# Patient Record
Sex: Male | Born: 1940 | Race: White | Hispanic: No | Marital: Single | Smoking: Never smoker
Health system: Southern US, Community
[De-identification: ages and names within clinical notes are randomized; demographics above are authoritative.]

## PROBLEM LIST (undated history)

## (undated) DIAGNOSIS — G20 Parkinson's disease: Secondary | ICD-10-CM

## (undated) DIAGNOSIS — E7800 Pure hypercholesterolemia, unspecified: Secondary | ICD-10-CM

## (undated) DIAGNOSIS — I10 Essential (primary) hypertension: Secondary | ICD-10-CM

## (undated) DIAGNOSIS — E119 Type 2 diabetes mellitus without complications: Secondary | ICD-10-CM

---

## 1998-11-23 ENCOUNTER — Ambulatory Visit (HOSPITAL_COMMUNITY): Admission: RE | Admit: 1998-11-23 | Discharge: 1998-11-23 | Payer: Self-pay | Admitting: Family Medicine

## 1998-11-23 ENCOUNTER — Encounter: Payer: Self-pay | Admitting: Family Medicine

## 2019-09-06 ENCOUNTER — Ambulatory Visit: Payer: Self-pay | Attending: Internal Medicine

## 2019-09-06 DIAGNOSIS — Z23 Encounter for immunization: Secondary | ICD-10-CM | POA: Insufficient documentation

## 2019-09-06 NOTE — Progress Notes (Signed)
   Covid-19 Vaccination Clinic  Name:  John Thornton    MRN: [id number] DOB: 28-Apr-1941  09/06/2019  Mr. John Thornton was observed post Covid-19 immunization for 15 minutes without incidence. He was provided with Vaccine Information Sheet and instruction to access the V-Safe system.   Mr. John Thornton was instructed to call 911 with any severe reactions post vaccine: Marland Kitchen Difficulty breathing  . Swelling of your face and throat  . A fast heartbeat  . A bad rash all over your body  . Dizziness and weakness    Immunizations Administered    Name Date Dose VIS Date Route   Pfizer COVID-19 Vaccine 09/06/2019 11:01 AM 0.3 mL 06/26/2019 Intramuscular   Manufacturer: [employer], [employer]   Lot: [id number]   NDC: [address]-2

## 2019-09-30 ENCOUNTER — Ambulatory Visit: Payer: Self-pay | Attending: Internal Medicine

## 2019-09-30 DIAGNOSIS — Z23 Encounter for immunization: Secondary | ICD-10-CM

## 2019-09-30 NOTE — Progress Notes (Signed)
   Covid-19 Vaccination Clinic  Name:  John Thornton    MRN: [id number] DOB: Nov 11, 1940  09/30/2019  [name] was observed post Covid-19 immunization for 15 minutes without incident. He was provided with Vaccine Information Sheet and instruction to access the V-Safe system.   [name] was instructed to call 911 with any severe reactions post vaccine: Marland Kitchen Difficulty breathing  . Swelling of face and throat  . A fast heartbeat  . A bad rash all over body  . Dizziness and weakness   Immunizations Administered    Name Date Dose VIS Date Route   Pfizer COVID-19 Vaccine 09/30/2019  9:57 AM 0.3 mL 06/26/2019 Intramuscular   Manufacturer: [employer], [employer]   Lot: [id number]   NDC: [address]-3

## 2019-11-17 DIAGNOSIS — G20A1 Parkinson's disease without dyskinesia, without mention of fluctuations: Secondary | ICD-10-CM | POA: Diagnosis present

## 2019-11-17 DIAGNOSIS — G20 Parkinson's disease: Secondary | ICD-10-CM | POA: Diagnosis present

## 2020-02-13 ENCOUNTER — Other Ambulatory Visit: Payer: Self-pay

## 2020-02-13 ENCOUNTER — Encounter (HOSPITAL_COMMUNITY): Payer: Self-pay | Admitting: Emergency Medicine

## 2020-02-13 ENCOUNTER — Emergency Department (HOSPITAL_COMMUNITY)
Admission: EM | Admit: 2020-02-13 | Discharge: 2020-02-13 | Disposition: A | Discharge status: Home / Self Care | Payer: No Typology Code available for payment source | Attending: Emergency Medicine | Admitting: Emergency Medicine

## 2020-02-13 ENCOUNTER — Emergency Department (HOSPITAL_COMMUNITY): Payer: No Typology Code available for payment source

## 2020-02-13 DIAGNOSIS — G20 Parkinson's disease: Secondary | ICD-10-CM | POA: Diagnosis not present

## 2020-02-13 DIAGNOSIS — E1165 Type 2 diabetes mellitus with hyperglycemia: Secondary | ICD-10-CM | POA: Insufficient documentation

## 2020-02-13 DIAGNOSIS — I10 Essential (primary) hypertension: Secondary | ICD-10-CM | POA: Diagnosis not present

## 2020-02-13 DIAGNOSIS — R739 Hyperglycemia, unspecified: Secondary | ICD-10-CM

## 2020-02-13 HISTORY — DX: Pure hypercholesterolemia, unspecified: E78.00

## 2020-02-13 HISTORY — DX: Parkinson's disease: G20

## 2020-02-13 HISTORY — DX: Essential (primary) hypertension: I10

## 2020-02-13 HISTORY — DX: Type 2 diabetes mellitus without complications: E11.9

## 2020-02-13 LAB — BASIC METABOLIC PANEL
Anion gap: 11 (ref 5–15)
BUN: 17 mg/dL (ref 8–23)
CO2: 23 mmol/L (ref 22–32)
Calcium: 8.5 mg/dL — ABNORMAL LOW (ref 8.9–10.3)
Chloride: 99 mmol/L (ref 98–111)
Creatinine, Ser: 0.9 mg/dL (ref 0.61–1.24)
GFR calc Af Amer: >60 mL/min (ref >60)
GFR calc non Af Amer: >60 mL/min (ref >60)
Glucose, Bld: 363 mg/dL — ABNORMAL HIGH (ref 70–99)
Potassium: 3.8 mmol/L (ref 3.5–5.1)
Sodium: 133 mmol/L — ABNORMAL LOW (ref 135–145)

## 2020-02-13 LAB — CBC WITH DIFFERENTIAL/PLATELET
Abs Immature Granulocytes: 0.02 10*3/uL (ref 0.00–0.07)
Basophils Absolute: 0 10*3/uL (ref 0.0–0.1)
Basophils Relative: 1 %
Eosinophils Absolute: 0.1 10*3/uL (ref 0.0–0.5)
Eosinophils Relative: 1 %
HCT: 34.3 % — ABNORMAL LOW (ref 39.0–52.0)
Hemoglobin: 12.3 g/dL — ABNORMAL LOW (ref 13.0–17.0)
Immature Granulocytes: 0 %
Lymphocytes Relative: 26 %
Lymphs Abs: 1.8 10*3/uL (ref 0.7–4.0)
MCH: 33.1 pg (ref 26.0–34.0)
MCHC: 35.9 g/dL (ref 30.0–36.0)
MCV: 92.2 fL (ref 80.0–100.0)
Monocytes Absolute: 0.6 10*3/uL (ref 0.1–1.0)
Monocytes Relative: 9 %
Neutro Abs: 4.4 10*3/uL (ref 1.7–7.7)
Neutrophils Relative %: 63 %
Platelets: 213 10*3/uL (ref 150–400)
RBC: 3.72 MIL/uL — ABNORMAL LOW (ref 4.22–5.81)
RDW: 12.3 % (ref 11.5–15.5)
WBC: 7 10*3/uL (ref 4.0–10.5)
nRBC: 0 % (ref 0.0–0.2)

## 2020-02-13 LAB — URINALYSIS, ROUTINE W REFLEX MICROSCOPIC
Bacteria, UA: NONE SEEN
Bilirubin Urine: NEGATIVE
Glucose, UA: >=500 mg/dL — AB
Hgb urine dipstick: NEGATIVE
Ketones, ur: NEGATIVE mg/dL
Leukocytes,Ua: NEGATIVE
Nitrite: NEGATIVE
Protein, ur: NEGATIVE mg/dL
Specific Gravity, Urine: 1.018 (ref 1.005–1.030)
pH: 5 (ref 5.0–8.0)

## 2020-02-13 LAB — CBG MONITORING, ED
Glucose-Capillary: 328 mg/dL — ABNORMAL HIGH (ref 70–99)
Glucose-Capillary: 275 mg/dL — ABNORMAL HIGH (ref 70–99)

## 2020-02-13 MED ORDER — SODIUM CHLORIDE 0.9 % IV BOLUS
1000.0000 mL | Freq: Once | INTRAVENOUS | Status: AC
  Administered 2020-02-13: 1000 mL via INTRAVENOUS

## 2020-02-13 NOTE — ED Triage Notes (Signed)
Pt states his neighbor checked on him today, BS 389 and with confusion, unsteady gait that started at 0600 this morning,

## 2020-02-13 NOTE — Discharge Instructions (Addendum)
You were evaluated in the Emergency Department and after careful evaluation, we did not find any emergent condition requiring admission or further testing in the hospital.  Your exam/testing today was overall reassuring.  It is very important that you take your diabetes medications as directed.  We recommend close follow-up with your primary care doctor to discuss your medications.  Please return to the Emergency Department if you experience any worsening of your condition.  Thank you for allowing [place] to be a part of your care.

## 2020-02-13 NOTE — ED Provider Notes (Signed)
[facility]-EMERGENCY DEPT [facility] Emergency Department Provider Note MRN:  [id number]  Arrival date & time: 02/13/20     Chief Complaint   Hyperglycemia   History of Present Illness   John Thornton is a 79 y.o. year-old male with a history of hypertension, diabetes presenting to the ED with chief complaint of hyperglycemia.  Patient feeling confused and "foggy headed" today.  Denies fever, no cough, no dysuria.  Denies chest pain or shortness of breath, no abdominal pain.  No vomiting or diarrhea.  Thinks that his blood sugar is making him feel off.  Symptoms mild to moderate, constant.  Review of Systems  A complete 10 system review of systems was obtained and all systems are negative except as noted in the HPI and PMH.   Patient's Health History    Past Medical History:  Diagnosis Date  . Diabetes mellitus without complication (HCC)   . Hypercholesteremia   . Hypertension   . Parkinson disease (HCC)     History reviewed. No pertinent surgical history.  History reviewed. No pertinent family history.  Social History   Socioeconomic History  . Marital status: Single    Spouse name: Not on file  . Number of children: Not on file  . Years of education: Not on file  . Highest education level: Not on file  Occupational History  . Not on file  Tobacco Use  . Smoking status: Never Smoker  . Smokeless tobacco: Never Used  Substance and Sexual Activity  . Alcohol use: Never  . Drug use: Never  . Sexual activity: Not on file  Other Topics Concern  . Not on file  Social History Narrative  . Not on file   Social Determinants of Health   Financial Resource Strain:   . Difficulty of Paying Living Expenses:   Food Insecurity:   . Worried About Programme researcher, broadcasting/film/video in the Last Year:   . Barista in the Last Year:   Transportation Needs:   . Freight forwarder (Medical):   Marland Kitchen Lack of Transportation (Non-Medical):   Physical Activity:   . Days of Exercise per  Week:   . Minutes of Exercise per Session:   Stress:   . Feeling of Stress :   Social Connections:   . Frequency of Communication with Friends and Family:   . Frequency of Social Gatherings with Friends and Family:   . Attends Religious Services:   . Active Member of Clubs or Organizations:   . Attends Banker Meetings:   Marland Kitchen Marital Status:   Intimate Partner Violence:   . Fear of Current or Ex-Partner:   . Emotionally Abused:   Marland Kitchen Physically Abused:   . Sexually Abused:      Physical Exam   Vitals:   02/13/20 1358 02/13/20 1428  BP: (!) 151/72 (!) 120/59  Pulse:  66  Resp: 20 18  Temp:    SpO2:  95%    CONSTITUTIONAL: Well-appearing, NAD NEURO:  Alert and oriented x 3, no focal deficits EYES:  eyes equal and reactive ENT/NECK:  no LAD, no JVD CARDIO: Regular rate, well-perfused, normal S1 and S2 PULM:  CTAB no wheezing or rhonchi GI/GU:  normal bowel sounds, non-distended, non-tender MSK/SPINE:  No gross deformities, no edema SKIN:  no rash, atraumatic PSYCH:  Appropriate speech and behavior  *Additional and/or pertinent findings included in MDM below  Diagnostic and Interventional Summary    EKG Interpretation  Date/Time:  Saturday February 13 2020 12:45:26 EDT Ventricular Rate:  67 PR Interval:  208 QRS Duration: 76 QT Interval:  408 QTC Calculation: 431 R Axis:   21 Text Interpretation: Normal sinus rhythm Anteroseptal infarct , age undetermined Abnormal ECG Confirmed by Kennis Carina [phone]) on 02/13/2020 3:48:48 PM      Labs Reviewed  CBC WITH DIFFERENTIAL/PLATELET - Abnormal; Notable for the following components:      Result Value   RBC 3.72 (*)    Hemoglobin 12.3 (*)    HCT 34.3 (*)    All other components within normal limits  BASIC METABOLIC PANEL - Abnormal; Notable for the following components:   Sodium 133 (*)    Glucose, Bld 363 (*)    Calcium 8.5 (*)    All other components within normal limits  URINALYSIS, ROUTINE W REFLEX  MICROSCOPIC - Abnormal; Notable for the following components:   Color, Urine STRAW (*)    Glucose, UA >=500 (*)    All other components within normal limits  CBG MONITORING, ED - Abnormal; Notable for the following components:   Glucose-Capillary 328 (*)    All other components within normal limits  CBG MONITORING, ED - Abnormal; Notable for the following components:   Glucose-Capillary 275 (*)    All other components within normal limits    DG Chest Port 1 View  Final Result      Medications  sodium chloride 0.9 % bolus 1,000 mL (0 mLs Intravenous Stopped 02/13/20 1546)     Procedures  /  Critical Care Procedures  ED Course and Medical Decision Making  I have reviewed the triage vital signs, the nursing notes, and pertinent available records from the EMR.  Listed above are laboratory and imaging tests that I personally ordered, reviewed, and interpreted and then considered in my medical decision making (see below for details).      Hyperglycemia with infectious work-up normal, no evidence of DKA in the labs.  Patient is without any focal neurological deficits, mild confusion seems to be explained by dehydration and elevated blood sugar.  Patient is feeling much better after liter of fluids.  Continued reassuring neurological exam.  Further history from daughter reveals that he has been noncompliant with his Metformin.  Advised compliance and close PCP follow-up.  Glucose is downtrending appropriate for discharge.    Elmer Sow. Pilar Plate, MD [facility] Emergency Medicine [facility] [email or web address]  Final Clinical Impressions(s) / ED Diagnoses     ICD-10-CM   1. Hyperglycemia  R73.9     ED Discharge Orders    None       Discharge Instructions Discussed with and Provided to Patient:     Discharge Instructions     You were evaluated in the Emergency Department and after careful evaluation, we did not find any emergent condition requiring admission or  further testing in the hospital.  Your exam/testing today was overall reassuring.  It is very important that you take your diabetes medications as directed.  We recommend close follow-up with your primary care doctor to discuss your medications.  Please return to the Emergency Department if you experience any worsening of your condition.  Thank you for allowing [place] to be a part of your care.       Sabas Sous, MD 02/13/20 1623

## 2020-09-13 ENCOUNTER — Observation Stay (HOSPITAL_COMMUNITY)
Admission: EM | Admit: 2020-09-13 | Discharge: 2020-09-14 | Disposition: A | Discharge status: Home / Self Care | Payer: No Typology Code available for payment source | Attending: Internal Medicine | Admitting: Internal Medicine

## 2020-09-13 ENCOUNTER — Other Ambulatory Visit: Payer: Self-pay

## 2020-09-13 ENCOUNTER — Emergency Department (HOSPITAL_COMMUNITY): Payer: No Typology Code available for payment source

## 2020-09-13 ENCOUNTER — Encounter (HOSPITAL_COMMUNITY): Payer: Self-pay | Admitting: Emergency Medicine

## 2020-09-13 DIAGNOSIS — Z79899 Other long term (current) drug therapy: Secondary | ICD-10-CM | POA: Insufficient documentation

## 2020-09-13 DIAGNOSIS — Z20822 Contact with and (suspected) exposure to covid-19: Secondary | ICD-10-CM | POA: Diagnosis not present

## 2020-09-13 DIAGNOSIS — Z7982 Long term (current) use of aspirin: Secondary | ICD-10-CM | POA: Insufficient documentation

## 2020-09-13 DIAGNOSIS — E1165 Type 2 diabetes mellitus with hyperglycemia: Secondary | ICD-10-CM

## 2020-09-13 DIAGNOSIS — Z7984 Long term (current) use of oral hypoglycemic drugs: Secondary | ICD-10-CM | POA: Diagnosis not present

## 2020-09-13 DIAGNOSIS — R4781 Slurred speech: Secondary | ICD-10-CM | POA: Diagnosis present

## 2020-09-13 DIAGNOSIS — E785 Hyperlipidemia, unspecified: Secondary | ICD-10-CM

## 2020-09-13 DIAGNOSIS — E66811 Obesity, class 1: Secondary | ICD-10-CM

## 2020-09-13 DIAGNOSIS — Z7902 Long term (current) use of antithrombotics/antiplatelets: Secondary | ICD-10-CM | POA: Diagnosis not present

## 2020-09-13 DIAGNOSIS — G20 Parkinson's disease: Secondary | ICD-10-CM | POA: Diagnosis not present

## 2020-09-13 DIAGNOSIS — E782 Mixed hyperlipidemia: Secondary | ICD-10-CM | POA: Diagnosis not present

## 2020-09-13 DIAGNOSIS — E669 Obesity, unspecified: Secondary | ICD-10-CM | POA: Diagnosis not present

## 2020-09-13 DIAGNOSIS — I639 Cerebral infarction, unspecified: Secondary | ICD-10-CM | POA: Diagnosis not present

## 2020-09-13 DIAGNOSIS — I10 Essential (primary) hypertension: Secondary | ICD-10-CM | POA: Diagnosis not present

## 2020-09-13 DIAGNOSIS — G20A1 Parkinson's disease without dyskinesia, without mention of fluctuations: Secondary | ICD-10-CM | POA: Diagnosis present

## 2020-09-13 LAB — CBC
HCT: 37.1 % — ABNORMAL LOW (ref 39.0–52.0)
Hemoglobin: 12.9 g/dL — ABNORMAL LOW (ref 13.0–17.0)
MCH: 31.9 pg (ref 26.0–34.0)
MCHC: 34.8 g/dL (ref 30.0–36.0)
MCV: 91.8 fL (ref 80.0–100.0)
Platelets: 239 10*3/uL (ref 150–400)
RBC: 4.04 MIL/uL — ABNORMAL LOW (ref 4.22–5.81)
RDW: 12.7 % (ref 11.5–15.5)
WBC: 8.5 10*3/uL (ref 4.0–10.5)
nRBC: 0 % (ref 0.0–0.2)

## 2020-09-13 LAB — PROTIME-INR
INR: 0.9 (ref 0.8–1.2)
Prothrombin Time: 12.2 seconds (ref 11.4–15.2)

## 2020-09-13 LAB — DIFFERENTIAL
Abs Immature Granulocytes: 0.03 10*3/uL (ref 0.00–0.07)
Basophils Absolute: 0 10*3/uL (ref 0.0–0.1)
Basophils Relative: 1 %
Eosinophils Absolute: 0.1 10*3/uL (ref 0.0–0.5)
Eosinophils Relative: 1 %
Immature Granulocytes: 0 %
Lymphocytes Relative: 25 %
Lymphs Abs: 2.1 10*3/uL (ref 0.7–4.0)
Monocytes Absolute: 0.7 10*3/uL (ref 0.1–1.0)
Monocytes Relative: 9 %
Neutro Abs: 5.5 10*3/uL (ref 1.7–7.7)
Neutrophils Relative %: 64 %

## 2020-09-13 LAB — COMPREHENSIVE METABOLIC PANEL
ALT: 37 U/L (ref 0–44)
AST: 24 U/L (ref 15–41)
Albumin: 4 g/dL (ref 3.5–5.0)
Alkaline Phosphatase: 65 U/L (ref 38–126)
Anion gap: 11 (ref 5–15)
BUN: 21 mg/dL (ref 8–23)
CO2: 24 mmol/L (ref 22–32)
Calcium: 8.9 mg/dL (ref 8.9–10.3)
Chloride: 103 mmol/L (ref 98–111)
Creatinine, Ser: 0.99 mg/dL (ref 0.61–1.24)
GFR, Estimated: >60 mL/min (ref >60)
Glucose, Bld: 162 mg/dL — ABNORMAL HIGH (ref 70–99)
Potassium: 3.8 mmol/L (ref 3.5–5.1)
Sodium: 138 mmol/L (ref 135–145)
Total Bilirubin: 0.5 mg/dL (ref 0.3–1.2)
Total Protein: 7.7 g/dL (ref 6.5–8.1)

## 2020-09-13 LAB — CBG MONITORING, ED: Glucose-Capillary: 155 mg/dL — ABNORMAL HIGH (ref 70–99)

## 2020-09-13 LAB — APTT: aPTT: 34 seconds (ref 24–36)

## 2020-09-13 MED ORDER — ASPIRIN 325 MG PO TABS
325.0000 mg | ORAL_TABLET | Freq: Once | ORAL | Status: DC
  Filled 2020-09-13: qty 1

## 2020-09-13 NOTE — ED Notes (Signed)
Patient transported to CT via stretcher accompanied by radiology staff. 

## 2020-09-13 NOTE — H&P (Signed)
History and Physical  John Thornton TIR:[id number] DOB: 1940-11-02 DOA: 09/13/2020  Referring physician: Pauline Aus, PA-C PCP: [facility]  Patient coming from: Home  Chief Complaint: Slurred speech  HPI: John Thornton is a 80 y.o. male with medical history significant for hypertension, Parkinson disease, and type 2 diabetes mellitus who presents to the Emergency Department due to slurred speech and imbalance which started yesterday (2/28) around 11:30 AM. Patient states that he noted sudden onset of speech difficulty with slurring of words and bumping into things when walking. He denies chest pain, shortness of breath, headache, numbness, tingling or weakness. He denies prior history of stroke.  ED Course:  In the emergency department, he was hemodynamically stable. Work-up in the ED showed normocystic anemia and normal BMP except for hyperglycemia. CT of head without contrast showed:  1. Abnormal 2.8 by 1.2 by 1.8 cm focus of hypodensity in the left periventricular white matter extending along the upper portion of the left lentiform nucleus towards the external capsule concerning for potential subacute infarct. 2. Approximately 0.7 cm hypodensity in the left thalamus, favoring age-indeterminate lacunar infarct. 3. 0.7 by 0.4 cm hypodense lesion in the vicinity of the anterior limb left internal capsule, potentially also a small age indeterminate lacunar infarct.  Review of Systems: Constitutional: Negative for chills and fever.  HENT: Negative for ear pain and sore throat.   Eyes: Negative for pain and visual disturbance.  Respiratory: Negative for cough, chest tightness and shortness of breath.   Cardiovascular: Negative for chest pain and palpitations.  Gastrointestinal: Negative for abdominal pain and vomiting.  Endocrine: Negative for polyphagia and polyuria.  Genitourinary: Negative for decreased urine volume, dysuria, enuresis, hematuria Musculoskeletal: Negative for  arthralgias and back pain.  Skin: Negative for color change and rash.  Allergic/Immunologic: Negative for immunocompromised state.  Neurological: Positive for gait problem and slurred speech. Negative for syncope,  weakness, light-headedness and headaches.  Hematological: Does not bruise/bleed easily.  All other systems reviewed and are negative   Past Medical History:  Diagnosis Date  . Diabetes mellitus without complication (HCC)   . Hypercholesteremia   . Hypertension   . Parkinson disease (HCC)    History reviewed. No pertinent surgical history.  Social History:  reports that he has never smoked. He has never used smokeless tobacco. He reports that he does not drink alcohol and does not use drugs.   Not on File  No family history on file.    Prior to Admission medications   Medication Sig Start Date End Date Taking? Authorizing Provider  amLODipine (NORVASC) 10 MG tablet Take 10 mg by mouth daily.   Yes [provider]  carbidopa-levodopa (SINEMET IR) 25-100 MG tablet Take 1 tablet by mouth See admin instructions. Take 2 and 1/2  tablets 3 times a day   Yes [provider]  glipiZIDE (GLUCOTROL) 5 MG tablet Take 10 mg by mouth 2 (two) times daily before a meal. 30 minutes prior to meal.   Yes [provider]  lisinopril (ZESTRIL) 40 MG tablet Take 40 mg by mouth daily. For symbolic blood pressure less than100, avoid potassium or artificial salt.   Yes [provider]  metFORMIN (GLUCOPHAGE-XR) 750 MG 24 hr tablet Take 750 mg by mouth in the morning and at bedtime.   Yes [provider]  Omega 3 1000 MG CAPS Take 1 capsule by mouth in the morning and at bedtime.   Yes [provider]  pravastatin (PRAVACHOL) 40 MG  tablet Take by mouth daily.   Yes [provider]  propranolol ER (INDERAL LA) 160 MG SR capsule Take 160 mg by mouth in the morning and at bedtime.   Yes [provider]  metFORMIN (GLUCOPHAGE)  500 MG tablet Take by mouth. Patient not taking: Reported on 09/13/2020    [provider]    Physical Exam: BP (!) 151/61 (BP Location: Left Arm)   Pulse 60   Temp 98 F (36.7 C) (Oral)   Resp 18   Ht 5\' 5"  (1.651 m)   Wt 86.6 kg   SpO2 99%   BMI 31.77 kg/m   . General: 80 y.o. year-old male well developed well nourished in no acute distress.  Alert and oriented x3. 62 HEENT: NCAT, EOMI . Neck: Supple, trachea medial . Cardiovascular: Regular rate and rhythm with no rubs or gallops.  No thyromegaly or JVD noted.   2/4 pulses in all 4 extremities. Marland Kitchen Respiratory: Clear to auscultation with no wheezes or rales. Good inspiratory effort. . Abdomen: Soft nontender nondistended with normal bowel sounds x4 quadrants. . Muskuloskeletal: No cyanosis, clubbing or edema noted bilaterally . Neuro: Positive for dysarthria, normal nasolabial fold.  Normal finger-to-nose, no pronator drift. . Skin: No ulcerative lesions noted or rashes . Psychiatry: Judgement and insight appear normal. Mood is appropriate for condition and setting          Labs on Admission:  Basic Metabolic Panel: Recent Labs  Lab 09/13/20 2025  NA 138  K 3.8  CL 103  CO2 24  GLUCOSE 162*  BUN 21  CREATININE 0.99  CALCIUM 8.9   Liver Function Tests: Recent Labs  Lab 09/13/20 2025  AST 24  ALT 37  ALKPHOS 65  BILITOT 0.5  PROT 7.7  ALBUMIN 4.0   No results for input(s): LIPASE, AMYLASE in the last 168 hours. No results for input(s): AMMONIA in the last 168 hours. CBC: Recent Labs  Lab 09/13/20 2025  WBC 8.5  NEUTROABS 5.5  HGB 12.9*  HCT 37.1*  MCV 91.8  PLT 239   Cardiac Enzymes: No results for input(s): CKTOTAL, CKMB, CKMBINDEX, TROPONINI in the last 168 hours.  BNP (last 3 results) No results for input(s): BNP in the last 8760 hours.  ProBNP (last 3 results) No results for input(s): PROBNP in the last 8760 hours.  CBG: Recent Labs  Lab 09/13/20 2029  GLUCAP 155*     Radiological Exams on Admission: CT HEAD WO CONTRAST  Result Date: 09/13/2020 CLINICAL DATA:  Slurred speech starting at 11 a.m. yesterday EXAM: CT HEAD WITHOUT CONTRAST TECHNIQUE: Contiguous axial images were obtained from the base of the skull through the vertex without intravenous contrast. COMPARISON:  None. FINDINGS: Brain: Abnormal 2.8 by 1.2 by 1.8 cm focus of hypodensity in the left periventricular white matter extending along the upper portion of the left lentiform nucleus towards the external capsule, concerning for potential subacute infarct. Approximately 0.7 cm hypodensity in the left thalamus, favoring age-indeterminate lacunar infarct. 0.7 by 0.4 cm hypodense lesion in the vicinity of the anterior limb left internal capsule on image 16 of series 2, potentially also a small age indeterminate lacunar infarct. Otherwise, The brainstem, cerebellum, cerebral peduncles, thalami, basal ganglia, basilar cisterns, and ventricular system appear within normal limits. No appreciable mass lesion or intracranial hemorrhage. Vascular: There is atherosclerotic calcification of the cavernous carotid arteries bilaterally. Skull: Unremarkable Sinuses/Orbits: Old right medial orbital wall fracture. Other: No supplemental non-categorized findings. IMPRESSION: 1. Abnormal 2.8 by 1.2  by 1.8 cm focus of hypodensity in the left periventricular white matter extending along the upper portion of the left lentiform nucleus towards the external capsule concerning for potential subacute infarct. 2. Approximately 0.7 cm hypodensity in the left thalamus, favoring age-indeterminate lacunar infarct. 3. 0.7 by 0.4 cm hypodense lesion in the vicinity of the anterior limb left internal capsule, potentially also a small age indeterminate lacunar infarct. 4. Old right medial orbital wall fracture. 5. Atherosclerosis. Critical Value/emergent results were called by telephone at the time of interpretation on 09/13/2020 at 9:43 pm to  provider Dr. Vanetta Mulders, who verbally acknowledged these results. Electronically Signed   By: Gaylyn Rong M.D.   On: 09/13/2020 21:44    EKG: I independently viewed the EKG done and my findings are as followed: Sinus bradycardia at a rate of 57 bpm with prolonged PR interval  Assessment/Plan Present on Admission: . CVA (cerebrovascular accident) (HCC) . Parkinson disease (HCC)  Principal Problem:   CVA (cerebrovascular accident) [facility]) Active Problems:   Parkinson disease (HCC)   Hyperglycemia due to diabetes mellitus (HCC)   Essential hypertension   Hyperlipidemia   Obesity (BMI 30.0-34.9)   Slurred speech and imbalance, r/o acute CVA CT head without contrast was suggestive of subacute infarct Patient will be admitted to telemetry unit  Bilateral carotid ultrasound in the morning Echocardiogram in the morning MRI of brain without contrast in the morning Aspirin 325 Mg x1 was given in the ED, we shall continue with aspirin and statin Continue fall precautions and neuro checks Lipid panel and hemoglobin A1c will be checked Continue PT/SLP/OT eval and treat Bedside swallow eval by nursing prior to diet Patient's case was discussed with neuro hospitalist (Dr. Thomasena Edis) and he felt that patient is stable enough to be admitted to [facility] and for MRI to be done in the morning per ED PA. Neurology will be consulted to see patient in the morning  Hyperglycemia secondary to T2DM Continue ISS and hypoglycemic protocol Metformin and glipizide will be held at this time  Essential pretension Permissive hypertension will be allowed at this time  Hyperlipidemia Continue Pravachol and omega-3 after patient has been cleared with swallow eval  Parkinson's disease Continue Sinemet after patient passes swallow eval  Obesity (BMI 31.77) Patient was counseled on diet and lifestyle modification  DVT prophylaxis: SCDs (consider restarting chemoprophylaxis after MRI rules out  intracranial bleeding)  Code Status: Full code  Family Communication: None at bedside  Disposition Plan:  Patient is from:                        home Anticipated DC to:                   SNF or family members home Anticipated DC date:               2-3 days Anticipated DC barriers:           Inpatient required for further stroke work-up and neurology consult   Consults called: Neurology  Admission status: Observation   Frankey Shown MD [facility]  09/14/2020, 12:20 AM

## 2020-09-13 NOTE — ED Notes (Signed)
LKW 09/12/2020 1100. Notified Tammy Triplett of neuro changes.

## 2020-09-13 NOTE — ED Provider Notes (Signed)
[facility] EMERGENCY DEPARTMENT Provider Note   CSN: [id number] Arrival date & time: 09/13/20  2012     History Chief Complaint  Patient presents with  . Aphasia    John Thornton is a 80 y.o. male.  HPI      John Thornton is a 80 y.o. male with past medical history of hypertension, Parkinson disease, and type 2 diabetes who presents to the Emergency Department complaining of slurred speech and issues with his balance.  Symptoms began at 11:30 AM yesterday.  States that he noticed he was having difficulty with his speech and slurring his words and also bumping into things when walking.  Denies headache, visual changes, chest pain, shortness of breath or numbness or weakness of the upper or lower extremities.  No history of previous strokes.  No aggravating or alleviating factors.  Past Medical History:  Diagnosis Date  . Diabetes mellitus without complication (HCC)   . Hypercholesteremia   . Hypertension   . Parkinson disease (HCC)     There are no problems to display for this patient.   History reviewed. No pertinent surgical history.     No family history on file.  Social History   Tobacco Use  . Smoking status: Never Smoker  . Smokeless tobacco: Never Used  Substance Use Topics  . Alcohol use: Never  . Drug use: Never    Home Medications Prior to Admission medications   Medication Sig Start Date End Date Taking? Authorizing Provider  amLODipine (NORVASC) 10 MG tablet Take 10 mg by mouth daily.   Yes [provider]  carbidopa-levodopa (SINEMET IR) 25-100 MG tablet Take 1 tablet by mouth See admin instructions. Take 2 and 1/2  tablets 3 times a day   Yes [provider]  glipiZIDE (GLUCOTROL) 5 MG tablet Take 10 mg by mouth 2 (two) times daily before a meal. 30 minutes prior to meal.   Yes [provider]  lisinopril (ZESTRIL) 40 MG tablet Take 40 mg by mouth daily. For symbolic blood pressure less than100, avoid potassium or  artificial salt.   Yes [provider]  metFORMIN (GLUCOPHAGE-XR) 750 MG 24 hr tablet Take 750 mg by mouth in the morning and at bedtime.   Yes [provider]  Omega 3 1000 MG CAPS Take 1 capsule by mouth in the morning and at bedtime.   Yes [provider]  pravastatin (PRAVACHOL) 40 MG tablet Take by mouth daily.   Yes [provider]  propranolol ER (INDERAL LA) 160 MG SR capsule Take 160 mg by mouth in the morning and at bedtime.   Yes [provider]  metFORMIN (GLUCOPHAGE) 500 MG tablet Take by mouth. Patient not taking: Reported on 09/13/2020    [provider]    Allergies    Patient has no allergy information on record.  Review of Systems   Review of Systems  Constitutional: Negative for chills, fatigue and fever.  HENT: Negative for sore throat and trouble swallowing.   Respiratory: Negative for cough, shortness of breath and wheezing.   Cardiovascular: Negative for chest pain and palpitations.  Gastrointestinal: Negative for abdominal pain, diarrhea, nausea and vomiting.  Genitourinary: Negative for dysuria, flank pain and hematuria.  Musculoskeletal: Positive for gait problem. Negative for arthralgias, back pain, myalgias, neck pain and neck stiffness.  Skin: Negative for rash.  Neurological: Positive for speech difficulty. Negative for dizziness, syncope, facial asymmetry, weakness and numbness.  Hematological: Does not bruise/bleed easily.  Psychiatric/Behavioral:  Negative for confusion. The patient is not nervous/anxious.     Physical Exam Updated Vital Signs BP (!) 150/72   Pulse (!) 56   Temp 98.1 F (36.7 C)   Resp 15   Ht 5\' 5"  (1.651 m)   Wt 86.6 kg   SpO2 97%   BMI 31.77 kg/m   Physical Exam Vitals and nursing note reviewed.  Constitutional:      Appearance: Normal appearance. He is not toxic-appearing.  HENT:     Head: Normocephalic.     Mouth/Throat:     Mouth: Mucous membranes are moist.      Pharynx: Oropharynx is clear.  Eyes:     Extraocular Movements: Extraocular movements intact.     Conjunctiva/sclera: Conjunctivae normal.     Pupils: Pupils are equal, round, and reactive to light.  Neck:     Thyroid: No thyromegaly.     Meningeal: Kernig's sign absent.  Cardiovascular:     Rate and Rhythm: Normal rate and regular rhythm.     Pulses: Normal pulses.     Heart sounds: Murmur heard.    Pulmonary:     Effort: Pulmonary effort is normal. No respiratory distress.     Breath sounds: Normal breath sounds.  Chest:     Chest wall: No tenderness.  Abdominal:     Palpations: Abdomen is soft.     Tenderness: There is no abdominal tenderness. There is no guarding or rebound.  Musculoskeletal:        General: Normal range of motion.     Cervical back: Normal range of motion and neck supple.     Right lower leg: No edema.     Left lower leg: No edema.  Skin:    General: Skin is warm.     Capillary Refill: Capillary refill takes less than 2 seconds.     Findings: No rash.  Neurological:     Mental Status: He is alert and oriented to person, place, and time.     GCS: GCS eye subscore is 4. GCS verbal subscore is 5. GCS motor subscore is 6.     Cranial Nerves: Dysarthria present. No facial asymmetry.     Sensory: Sensation is intact.     Comments: Dysarthria noted.  No facial droop, no pronator drift, normal finger-nose testing.  No lower extremity drift negative Romberg     ED Results / Procedures / Treatments   Labs (all labs ordered are listed, but only abnormal results are displayed) Labs Reviewed  CBC - Abnormal; Notable for the following components:      Result Value   RBC 4.04 (*)    Hemoglobin 12.9 (*)    HCT 37.1 (*)    All other components within normal limits  COMPREHENSIVE METABOLIC PANEL - Abnormal; Notable for the following components:   Glucose, Bld 162 (*)    All other components within normal limits  CBG MONITORING, ED - Abnormal; Notable for the  following components:   Glucose-Capillary 155 (*)    All other components within normal limits  SARS CORONAVIRUS 2 (TAT 6-24 HRS)  PROTIME-INR  APTT  DIFFERENTIAL  RAPID URINE DRUG SCREEN, HOSP PERFORMED  URINALYSIS, ROUTINE W REFLEX MICROSCOPIC    EKG None  Radiology CT HEAD WO CONTRAST  Result Date: 09/13/2020 CLINICAL DATA:  Slurred speech starting at 11 a.m. yesterday EXAM: CT HEAD WITHOUT CONTRAST TECHNIQUE: Contiguous axial images were obtained from the base of the skull through the vertex without intravenous contrast. COMPARISON:  None. FINDINGS: Brain: Abnormal 2.8 by 1.2 by 1.8 cm focus of hypodensity in the left periventricular white matter extending along the upper portion of the left lentiform nucleus towards the external capsule, concerning for potential subacute infarct. Approximately 0.7 cm hypodensity in the left thalamus, favoring age-indeterminate lacunar infarct. 0.7 by 0.4 cm hypodense lesion in the vicinity of the anterior limb left internal capsule on image 16 of series 2, potentially also a small age indeterminate lacunar infarct. Otherwise, The brainstem, cerebellum, cerebral peduncles, thalami, basal ganglia, basilar cisterns, and ventricular system appear within normal limits. No appreciable mass lesion or intracranial hemorrhage. Vascular: There is atherosclerotic calcification of the cavernous carotid arteries bilaterally. Skull: Unremarkable Sinuses/Orbits: Old right medial orbital wall fracture. Other: No supplemental non-categorized findings. IMPRESSION: 1. Abnormal 2.8 by 1.2 by 1.8 cm focus of hypodensity in the left periventricular white matter extending along the upper portion of the left lentiform nucleus towards the external capsule concerning for potential subacute infarct. 2. Approximately 0.7 cm hypodensity in the left thalamus, favoring age-indeterminate lacunar infarct. 3. 0.7 by 0.4 cm hypodense lesion in the vicinity of the anterior limb left internal  capsule, potentially also a small age indeterminate lacunar infarct. 4. Old right medial orbital wall fracture. 5. Atherosclerosis. Critical Value/emergent results were called by telephone at the time of interpretation on 09/13/2020 at 9:43 pm to provider Dr. Vanetta Mulders, who verbally acknowledged these results. Electronically Signed   By: Gaylyn Rong M.D.   On: 09/13/2020 21:44    Procedures Procedures   Medications Ordered in ED Medications  aspirin tablet 325 mg (325 mg Oral Not Given 09/13/20 2240)    ED Course  I have reviewed the triage vital signs and the nursing notes.  Pertinent labs & imaging results that were available during my care of the patient were reviewed by me and considered in my medical decision making (see chart for details).     MDM Rules/Calculators/A&P                          Patient here from home with symptoms of dysarthria and unsteady gait.  Last known well 11:30 AM yesterday.  Discussed findings with neuro hospitalist, Dr. Thomasena Edis who felt that stroke is likely completed and subacute.  Felt that patient is stable for admission here and MRI tomorrow morning.  Recommends also giving patient full dose aspirin   2235 consulted triad hospitalist, Dr. Thomes Dinning who agrees to admit  Final Clinical Impression(s) / ED Diagnoses Final diagnoses:  Cerebrovascular accident (CVA), unspecified mechanism [facility])    Rx / DC Orders ED Discharge Orders    None       Pauline Aus, PA-C 09/13/20 2244    Vanetta Mulders, MD 09/21/20 1659

## 2020-09-13 NOTE — ED Triage Notes (Signed)
[facility] - pt started having slurred speech and not feeling right at 1100 yesterday. EMS states that pt is ambulatory with some balance issues at times.

## 2020-09-14 ENCOUNTER — Encounter (HOSPITAL_COMMUNITY): Payer: Self-pay | Admitting: Internal Medicine

## 2020-09-14 ENCOUNTER — Observation Stay (HOSPITAL_BASED_OUTPATIENT_CLINIC_OR_DEPARTMENT_OTHER): Payer: No Typology Code available for payment source

## 2020-09-14 ENCOUNTER — Observation Stay (HOSPITAL_COMMUNITY): Payer: No Typology Code available for payment source

## 2020-09-14 ENCOUNTER — Other Ambulatory Visit: Payer: Self-pay

## 2020-09-14 DIAGNOSIS — I6389 Other cerebral infarction: Secondary | ICD-10-CM

## 2020-09-14 DIAGNOSIS — E1165 Type 2 diabetes mellitus with hyperglycemia: Secondary | ICD-10-CM | POA: Diagnosis not present

## 2020-09-14 DIAGNOSIS — I10 Essential (primary) hypertension: Secondary | ICD-10-CM | POA: Diagnosis not present

## 2020-09-14 DIAGNOSIS — I639 Cerebral infarction, unspecified: Principal | ICD-10-CM

## 2020-09-14 DIAGNOSIS — E66811 Obesity, class 1: Secondary | ICD-10-CM

## 2020-09-14 DIAGNOSIS — E785 Hyperlipidemia, unspecified: Secondary | ICD-10-CM

## 2020-09-14 DIAGNOSIS — E669 Obesity, unspecified: Secondary | ICD-10-CM

## 2020-09-14 DIAGNOSIS — E782 Mixed hyperlipidemia: Secondary | ICD-10-CM | POA: Diagnosis not present

## 2020-09-14 LAB — GLUCOSE, CAPILLARY
Glucose-Capillary: 146 mg/dL — ABNORMAL HIGH (ref 70–99)
Glucose-Capillary: 174 mg/dL — ABNORMAL HIGH (ref 70–99)
Glucose-Capillary: 176 mg/dL — ABNORMAL HIGH (ref 70–99)
Glucose-Capillary: 176 mg/dL — ABNORMAL HIGH (ref 70–99)
Glucose-Capillary: 180 mg/dL — ABNORMAL HIGH (ref 70–99)

## 2020-09-14 LAB — ECHOCARDIOGRAM COMPLETE
AR max vel: 1 cm2
AV Area VTI: 1.13 cm2
AV Area mean vel: 1.05 cm2
AV Mean grad: 22.3 mmHg
AV Peak grad: 39.6 mmHg
Ao pk vel: 3.15 m/s
Area-P 1/2: 1.85 cm2
Height: 65 in
S' Lateral: 2.88 cm
Weight: 3054.69 oz

## 2020-09-14 LAB — CBC
HCT: 34.3 % — ABNORMAL LOW (ref 39.0–52.0)
Hemoglobin: 11.5 g/dL — ABNORMAL LOW (ref 13.0–17.0)
MCH: 31.1 pg (ref 26.0–34.0)
MCHC: 33.5 g/dL (ref 30.0–36.0)
MCV: 92.7 fL (ref 80.0–100.0)
Platelets: 217 10*3/uL (ref 150–400)
RBC: 3.7 MIL/uL — ABNORMAL LOW (ref 4.22–5.81)
RDW: 12.4 % (ref 11.5–15.5)
WBC: 7.5 10*3/uL (ref 4.0–10.5)
nRBC: 0 % (ref 0.0–0.2)

## 2020-09-14 LAB — PHOSPHORUS: Phosphorus: 3.3 mg/dL (ref 2.5–4.6)

## 2020-09-14 LAB — MAGNESIUM: Magnesium: 2 mg/dL (ref 1.7–2.4)

## 2020-09-14 LAB — SARS CORONAVIRUS 2 (TAT 6-24 HRS): SARS Coronavirus 2: NEGATIVE

## 2020-09-14 LAB — COMPREHENSIVE METABOLIC PANEL
ALT: 35 U/L (ref 0–44)
AST: 22 U/L (ref 15–41)
Albumin: 3.6 g/dL (ref 3.5–5.0)
Alkaline Phosphatase: 58 U/L (ref 38–126)
Anion gap: 9 (ref 5–15)
BUN: 18 mg/dL (ref 8–23)
CO2: 25 mmol/L (ref 22–32)
Calcium: 8.3 mg/dL — ABNORMAL LOW (ref 8.9–10.3)
Chloride: 102 mmol/L (ref 98–111)
Creatinine, Ser: 0.8 mg/dL (ref 0.61–1.24)
GFR, Estimated: >60 mL/min (ref >60)
Glucose, Bld: 157 mg/dL — ABNORMAL HIGH (ref 70–99)
Potassium: 3.6 mmol/L (ref 3.5–5.1)
Sodium: 136 mmol/L (ref 135–145)
Total Bilirubin: 0.7 mg/dL (ref 0.3–1.2)
Total Protein: 6.8 g/dL (ref 6.5–8.1)

## 2020-09-14 LAB — LIPID PANEL
Cholesterol: 149 mg/dL (ref 0–200)
HDL: 24 mg/dL — ABNORMAL LOW (ref >40)
LDL Cholesterol: 74 mg/dL (ref 0–99)
Total CHOL/HDL Ratio: 6.2 RATIO
Triglycerides: 257 mg/dL — ABNORMAL HIGH (ref <150)
VLDL: 51 mg/dL — ABNORMAL HIGH (ref 0–40)

## 2020-09-14 LAB — PROTIME-INR
INR: 1 (ref 0.8–1.2)
Prothrombin Time: 13.1 seconds (ref 11.4–15.2)

## 2020-09-14 LAB — APTT: aPTT: 35 seconds (ref 24–36)

## 2020-09-14 LAB — HEMOGLOBIN A1C
Hgb A1c MFr Bld: 8 % — ABNORMAL HIGH (ref 4.8–5.6)
Mean Plasma Glucose: 182.9 mg/dL

## 2020-09-14 MED ORDER — RESOURCE THICKENUP CLEAR PO POWD
ORAL | Status: DC | PRN
  Filled 2020-09-14: qty 125

## 2020-09-14 MED ORDER — INSULIN ASPART 100 UNIT/ML ~~LOC~~ SOLN
0.0000 [IU] | SUBCUTANEOUS | Status: DC
  Administered 2020-09-14 (×2): 3 [IU] via SUBCUTANEOUS
  Administered 2020-09-14: 2 [IU] via SUBCUTANEOUS
  Administered 2020-09-14 (×2): 3 [IU] via SUBCUTANEOUS

## 2020-09-14 MED ORDER — PERFLUTREN LIPID MICROSPHERE
1.0000 mL | INTRAVENOUS | Status: AC | PRN
  Administered 2020-09-14: 1 mL via INTRAVENOUS
  Filled 2020-09-14: qty 10

## 2020-09-14 MED ORDER — CLOPIDOGREL BISULFATE 75 MG PO TABS: 75.0000 mg | ORAL_TABLET | Freq: Every day | ORAL | 3 refills | Status: AC

## 2020-09-14 MED ORDER — RESOURCE THICKENUP CLEAR PO POWD: ORAL | 2 refills | Status: AC

## 2020-09-14 MED ORDER — ASPIRIN EC 81 MG PO TBEC: 81.0000 mg | DELAYED_RELEASE_TABLET | Freq: Every day | ORAL | 3 refills | Status: AC

## 2020-09-14 NOTE — Evaluation (Signed)
Physical Therapy Evaluation Patient Details Name: John Thornton MRN: [id number] DOB: 1940-08-30 Today's Date: 09/14/2020   History of Present Illness  John Thornton is a 80 y.o. male with medical history significant for hypertension, Parkinson disease, and type 2 diabetes mellitus who presents to the Emergency Department due to slurred speech and imbalance which started yesterday (2/28) around 11:30 AM. Patient states that he noted sudden onset of speech difficulty with slurring of words and bumping into things when walking. He denies chest pain, shortness of breath, headache, numbness, tingling or weakness. He denies prior history of stroke.    Clinical Impression  Patient functioning near baseline for functional mobility and gait other than drifting to the right during ambulation with occasional bumping into objects/walls, able to self correct without loss of balance and demonstrates fair/good return for going up/down stairs using 1 side rail.  Patient advised to have someone with him when he attempts to drive to ensure safety before attempting by himself with understanding acknowledged.  Patient appears to have no visual deficits during assessment for hemianopsia and ability to track objects with eyeballs.  Plan:  Patient discharged from physical therapy to care of nursing for ambulation daily as tolerated for length of stay.     Follow Up Recommendations Outpatient PT;Supervision - Intermittent;Supervision for mobility/OOB    Equipment Recommendations  None recommended by PT    Recommendations for Other Services       Precautions / Restrictions Precautions Precautions: None Restrictions Weight Bearing Restrictions: No      Mobility  Bed Mobility Overal bed mobility: Modified Independent             General bed mobility comments: Use of railing to pull from supine to sit.    Transfers Overall transfer level: Modified independent               General transfer  comment: slightly labored movement  Ambulation/Gait Ambulation/Gait assistance: Modified independent (Device/Increase time) Gait Distance (Feet): 200 Feet Assistive device: None Gait Pattern/deviations: WFL(Within Functional Limits);Drifts right/left Gait velocity: slightly decreased   General Gait Details: grossly WNL except mild drifting to the right with occasional bumping into wall, able to self correct without loss of balance  Stairs Stairs: Yes Stairs assistance: Modified independent (Device/Increase time) Stair Management: One rail Right;One rail Left;Alternating pattern;Step to pattern Number of Stairs: 10 General stair comments: slightly unsteady with occasional stumbling without loss of balance, swith between alternating and step to pattern when going up/down stairs using 1 side rail  Wheelchair Mobility    Modified Rankin (Stroke Patients Only)       Balance Overall balance assessment: Mild deficits observed, not formally tested Sitting-balance support: Feet unsupported Sitting balance-Leahy Scale: Good     Standing balance support: No upper extremity supported Standing balance-Leahy Scale: Fair (fair to good R side lean) Standing balance comment: R side lean.                             Pertinent Vitals/Pain Pain Assessment: No/denies pain    Home Living Family/patient expects to be discharged to:: Private residence Living Arrangements: Spouse/significant other Available Help at Discharge: Family;Available PRN/intermittently Type of Home: House Home Access: Level entry     Home Layout: One level Home Equipment: Walker - 2 wheels;Wheelchair - manual;Cane - single point;Other (comment)      Prior Function Level of Independence: Independent         Comments: Hydrographic surveyor,  drives, Also takes care of his wife who has dementia     Hand Dominance   Dominant Hand: Right    Extremity/Trunk Assessment   Upper Extremity  Assessment Upper Extremity Assessment: Defer to OT evaluation    Lower Extremity Assessment Lower Extremity Assessment: Overall WFL for tasks assessed    Cervical / Trunk Assessment Cervical / Trunk Assessment: Normal  Communication   Communication: Expressive difficulties  Cognition Arousal/Alertness: Awake/alert Behavior During Therapy: WFL for tasks assessed/performed Overall Cognitive Status: Within Functional Limits for tasks assessed                                        General Comments      Exercises     Assessment/Plan    PT Assessment All further PT needs can be met in the next venue of care  PT Problem List Decreased strength;Decreased activity tolerance;Decreased balance;Decreased mobility       PT Treatment Interventions      PT Goals (Current goals can be found in the Care Plan section)  Acute Rehab PT Goals Patient Stated Goal: Return home with family to assist PT Goal Formulation: With patient Time For Goal Achievement: 09/14/20 Potential to Achieve Goals: Good    Frequency     Barriers to discharge        Co-evaluation PT/OT/SLP Co-Evaluation/Treatment: Yes Reason for Co-Treatment: Complexity of the patient's impairments (multi-system involvement) PT goals addressed during session: Mobility/safety with mobility;Balance OT goals addressed during session: ADL's and self-care;Strengthening/ROM       AM-PAC PT "6 Clicks" Mobility  Outcome Measure Help needed turning from your back to your side while in a flat bed without using bedrails?: None Help needed moving from lying on your back to sitting on the side of a flat bed without using bedrails?: A Little Help needed moving to and from a bed to a chair (including a wheelchair)?: None Help needed standing up from a chair using your arms (e.g., wheelchair or bedside chair)?: None Help needed to walk in hospital room?: A Little Help needed climbing 3-5 steps with a railing? : A  Little 6 Click Score: 21    End of Session   Activity Tolerance: Patient tolerated treatment well Patient left: in chair;with call bell/phone within reach Nurse Communication: Mobility status PT Visit Diagnosis: Unsteadiness on feet (R26.81);Other abnormalities of gait and mobility (R26.89);Muscle weakness (generalized) (M62.81)    Time: 4103-0131 PT Time Calculation (min) (ACUTE ONLY): 20 min   Charges:   PT Evaluation $PT Eval Moderate Complexity: 1 Mod PT Treatments $Gait Training: 8-22 mins        2:21 PM, 09/14/20 Lonell Grandchild, MPT Physical Therapist with [facility] 336 [phone] office [phone] mobile phone

## 2020-09-14 NOTE — Progress Notes (Signed)
*  PRELIMINARY RESULTS* Echocardiogram 2D Echocardiogram with definity has been performed.  John Thornton 09/14/2020, 11:25 AM

## 2020-09-14 NOTE — Progress Notes (Signed)
Call received from [facility] radiology concerning patient's MRI brain results. Amion page sent to Dr. Gwenlyn Perking to review.

## 2020-09-14 NOTE — Evaluation (Signed)
Clinical/Bedside Swallow Evaluation Patient Details  Name: John Thornton MRN: [id number] Date of Birth: 1941/07/07  Today's Date: 09/14/2020 Time: SLP Start Time (ACUTE ONLY): 0935 SLP Stop Time (ACUTE ONLY): 0953 SLP Time Calculation (min) (ACUTE ONLY): 18 min  Past Medical History:  Past Medical History:  Diagnosis Date  . Diabetes mellitus without complication (HCC)   . Hypercholesteremia   . Hypertension   . Parkinson disease [facility])    Past Surgical History: History reviewed. No pertinent surgical history. HPI:  John Thornton is a 80 y.o. male with medical history significant for hypertension, Parkinson disease, and type 2 diabetes mellitus who presents to the Emergency Department due to slurred speech and imbalance which started yesterday (2/28) around 11:30 AM. Patient states that he noted sudden onset of speech difficulty with slurring of words and bumping into things when walking. He denies chest pain, shortness of breath, headache, numbness, tingling or weakness. He denies prior history of stroke. 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 Severe left and moderate right P2 PCA stenoses. Mild-to moderate narrowing of the distal left M1 MCA and additional multifocal mild narrowing. BSE ordered as Pt failed the Yale swallow screen.   Assessment / Plan / Recommendation Clinical Impression  Clinical swallow evaluation completed at bedside. Pt presents with mild right facial asymmetry and mild dysphonia (breathy quality). Pt with reduced labial seal resulting in right labial spillage with self presented cup sips of thin water and reduced oral control with both immediate and delayed coughing after thin water. Pt with improved control and no coughing when cued to take single cup sips, however he was unable to consistently complete independently suggestive of impulsivity and  reduced self awareness. When SLP commented on the coughing and relevance related to acute stroke, he stated that coughing is nothing unusual for him. Pt able to self present cup sips NTL water, puree, and graham crackers without incident. Recommend MBSS later this afternoon and ok to initiate D2/chopped and NTL until then, po medications whole in puree or with NTL, above to Pt and RN. Pt will also need SLE due to dysarthria and possible reduced appreciation of current deficits. SLP Visit Diagnosis: Dysphagia, unspecified (R13.10)    Aspiration Risk  Moderate aspiration risk    Diet Recommendation Dysphagia 2 (Fine chop);Nectar-thick liquid   Liquid Administration via: Cup Medication Administration: Whole meds with puree Supervision: Patient able to self feed;Full supervision/cueing for compensatory strategies Compensations: Small sips/bites;Multiple dry swallows after each bite/sip;Slow rate Postural Changes: Seated upright at 90 degrees;Remain upright for at least 30 minutes after po intake    Other  Recommendations Oral Care Recommendations: Oral care BID Other Recommendations: Clarify dietary restrictions;Order thickener from pharmacy;Prohibited food (jello, ice cream, thin soups)   Follow up Recommendations Skilled Nursing facility;Inpatient Rehab      Frequency and Duration min 2x/week  1 week       Prognosis Prognosis for Safe Diet Advancement: Fair      Swallow Study   General Date of Onset: 09/13/20 HPI: John Thornton is a 80 y.o. male with medical history significant for hypertension, Parkinson disease, and type 2 diabetes mellitus who presents to the Emergency Department due to slurred speech and imbalance which started yesterday (2/28) around 11:30 AM. Patient states that he noted sudden onset of speech difficulty with slurring of words and bumping into things when walking. He denies chest pain,  shortness of breath, headache, numbness, tingling or weakness. He denies prior  history of stroke. 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 Severe left and moderate right P2 PCA stenoses. Mild-to moderate narrowing of the distal left M1 MCA and additional multifocal mild narrowing. BSE ordered as Pt failed the Yale swallow screen. Type of Study: Bedside Swallow Evaluation Previous Swallow Assessment: N/A Diet Prior to this Study: NPO Temperature Spikes Noted: No Respiratory Status: Room air History of Recent Intubation: No Behavior/Cognition: Alert;Cooperative;Pleasant mood Oral Cavity Assessment: Within Functional Limits Oral Care Completed by SLP: Recent completion by staff Oral Cavity - Dentition: Adequate natural dentition Vision: Functional for self-feeding Self-Feeding Abilities: Able to feed self Patient Positioning: Upright in bed Baseline Vocal Quality: Low vocal intensity;Normal;Breathy (mild dysphonia noted) Volitional Cough: Weak Volitional Swallow: Able to elicit    Oral/Motor/Sensory Function Overall Oral Motor/Sensory Function: Mild impairment Facial ROM: Reduced right;Suspected CN VII (facial) dysfunction Facial Symmetry: Abnormal symmetry right;Suspected CN VII (facial) dysfunction Facial Strength: Reduced right;Suspected CN VII (facial) dysfunction Facial Sensation: Within Functional Limits Lingual ROM: Within Functional Limits Lingual Symmetry: Within Functional Limits Lingual Strength: Within Functional Limits Lingual Sensation: Within Functional Limits Velum:  (could not visualize) Mandible: Within Functional Limits   Ice Chips Ice chips: Within functional limits Presentation: Spoon   Thin Liquid Thin Liquid: Impaired Presentation: Cup;Self Fed Oral Phase Impairments: Reduced labial seal Oral Phase Functional Implications: Right anterior spillage Pharyngeal  Phase Impairments: Suspected delayed Swallow;Multiple  swallows;Cough - Immediate;Cough - Delayed    Nectar Thick Nectar Thick Liquid: Within functional limits Presentation: Cup;Self Fed   Honey Thick Honey Thick Liquid: Not tested   Puree Puree: Within functional limits Presentation: Self Fed;Spoon   Solid     Solid: Within functional limits Presentation: Self Fed     Thank you,  Havery Moros, CCC-SLP [phone]  John Thornton 09/14/2020,11:36 AM

## 2020-09-14 NOTE — Discharge Summary (Signed)
Physician Discharge Summary  TREMON SAINVIL RCV:[id number] DOB: 01-31-1941 DOA: 09/13/2020  PCP: [facility]  Admit date: 09/13/2020 Discharge date: 09/14/2020  Time spent: 30 minutes  Recommendations for Outpatient Follow-up:  1. Repeat basic metabolic panel to follow electrolytes renal function 2. Reassess blood pressure and adjust antihypertensive agents as needed 3. Close monitoring of patient's CBGs with further adjustment to hypoglycemic regimen as required.   Discharge Diagnoses:  Principal Problem:   CVA (cerebrovascular accident) [facility]) Active Problems:   Parkinson disease (HCC)   Hyperglycemia due to diabetes mellitus (HCC)   Essential hypertension   Hyperlipidemia   Obesity (BMI 30.0-34.9)   Discharge Condition: Stable and improved; patient discharged home with outpatient ambulatory referral for PT and speech therapy.  Will follow up with neurology service in 4 weeks and instructed to arrange follow-up with PCP in 10 days.  CODE STATUS: Full code.  Diet recommendation: Dysphagia 2 diet; low calorie, heart healthy and modify carbohydrates.  Filed Weights   09/13/20 2020  Weight: 86.6 kg    History of present illness:  As per H&P written by Dr. Thomes Dinning on 09/13/2020 John Thornton is a 80 y.o. male with medical history significant for hypertension, Parkinson disease, and type 2 diabetes mellituswho presents to the Emergency Department due to slurred speech and imbalance which started yesterday (2/28) around 11:30 AM. Patient states that he noted sudden onset of speech difficulty with slurring of words and bumping into things when walking. He denies chest pain, shortness of breath, headache, numbness, tingling or weakness. He denies prior history of stroke.  Patient was out of the therapeutic window for interventions.  ED Course:  In the emergency department, he was hemodynamically stable. Work-up in the ED showed normocystic anemia and normal BMP except for  hyperglycemia. CT of head without contrast showed:  1. Abnormal 2.8 by 1.2 by 1.8 cm focus of hypodensity in the left periventricular white matter extending along the upper portion of the left lentiform nucleus towards the external capsule concerning for potential subacute infarct. 2. Approximately 0.7 cm hypodensity in the left thalamus, favoring age-indeterminate lacunar infarct. 3. 0.7 by 0.4 cm hypodense lesion in the vicinity of the anterior limb left internal capsule, potentially also a small age indeterminate lacunar infarct.  Hospital Course:  1-acute left ganglia ischemic stroke: With residual dysarthria and right shifting gait. -Case discussed with neurology service who recommended antiplatelet therapy using aspirin and Plavix for 3 months. -Outpatient cardiac monitoring for 30 days -Follow-up in 4 weeks as an outpatient. -Continue risk factors modifications -Outpatient ambulatory speech therapy and physical therapy recommended  2-hyperlipidemia -Continue statins and fish oil. -Heart healthy diet has been encouraged.  3-hypertension -Continue home antihypertensive agents -Heart healthy/low-sodium dieting encouraged  4-type 2 diabetes mellitus -Resume home oral hypoglycemic agents -Outpatient follow-up with PCP to further adjust treatment as needed base on CBGs fluctuation.    5-type I obesity -Body mass index is 31.77 kg/m. -Low calorie diet, portion control and increase activity recommended.  6-Parkinson's disease -Continue Sinemet -Continue outpatient follow-up with neurology service.  7-dysphagia -As residual deficits from recent acute nonhemorrhagic ischemic stroke -Continue dysphagia 2 diet with nectar thick liquids at discharge. -Ambulatory referral for speech therapy recommended.  Procedures: See below for x-ray reports. 2D echo: Left ventricle ejection fraction 60 to 65%; normal ventricular function; no regional wall motion normalities.  Grade 1 diastolic  dysfunction.  No significant valvular disorder. Carotid Dopplers: Atherosclerotic disease involving bilateral carotid arteries estimated degree of stenosis in the  internal carotid arteries is less than 50% bilaterally.  Patent vertebral arteries with antegrade flow.  Consultations:  Neurology service (Dr. Gerilyn Pilgrim).  Discharge Exam: Vitals:   09/14/20 1128 09/14/20 1339  BP: (!) 158/66 133/66  Pulse: (!) 59 (!) 59  Resp: 16 18  Temp: 98.3 F (36.8 C) 98.2 F (36.8 C)  SpO2:  96%    General: Afebrile, no chest pain, no nausea or vomiting.  Patient denies shortness of breath or any other acute complaints.  Demonstrating mild dysarthria and expressed some weakness sensation on his right side.  No pronation drift seen; during physical therapy evaluation patient drifting towards the right with ambulation. Cardiovascular: S1 and S2; sinus rhythm.  No rubs, no gallops, no murmurs, no JVD. Respiratory: Clear to auscultation bilaterally; no using accessory muscles. Abdomen: Soft, nontender, nondistended, positive bowel sounds Extremities: No cyanosis or clubbing.  Discharge Instructions   Discharge Instructions    Ambulatory referral to Physical Therapy   Complete by: As directed    Speech and PT rehab after stroke   Diet - low sodium heart healthy   Complete by: As directed    Discharge instructions   Complete by: As directed    Take medications as prescribed. Follow up with neurology in 4 weeks (Dr. Gerilyn Pilgrim). Follow up with PCP in 10 days. Follow heart healthy and modified carb diet. Maintain adequate hydration. Dysphagia 2 diet.   Increase activity slowly   Complete by: As directed      Allergies as of 09/14/2020   Not on File     Medication List    TAKE these medications   amLODipine 10 MG tablet Commonly known as: NORVASC Take 10 mg by mouth daily.   aspirin EC 81 MG tablet Take 1 tablet (81 mg total) by mouth daily. Swallow whole.   carbidopa-levodopa 25-100 MG  tablet Commonly known as: SINEMET IR Take 1 tablet by mouth See admin instructions. Take 2 and 1/2  tablets 3 times a day   clopidogrel 75 MG tablet Commonly known as: Plavix Take 1 tablet (75 mg total) by mouth daily.   glipiZIDE 5 MG tablet Commonly known as: GLUCOTROL Take 10 mg by mouth 2 (two) times daily before a meal. 30 minutes prior to meal.   lisinopril 40 MG tablet Commonly known as: ZESTRIL Take 40 mg by mouth daily. For symbolic blood pressure less than100, avoid potassium or artificial salt.   metFORMIN 750 MG 24 hr tablet Commonly known as: GLUCOPHAGE-XR Take 750 mg by mouth in the morning and at bedtime. What changed: Another medication with the same name was removed. Continue taking this medication, and follow the directions you see here.   Omega 3 1000 MG Caps Take 1 capsule by mouth in the morning and at bedtime.   pravastatin 40 MG tablet Commonly known as: PRAVACHOL Take by mouth daily.   propranolol ER 160 MG SR capsule Commonly known as: INDERAL LA Take 160 mg by mouth in the morning and at bedtime.   Resource ThickenUp Clear Powd Use as need it to achieved nectar thick liquids.      Not on File  Follow-up Information    [facility]. Schedule an appointment as soon as possible for a visit in 10 day(s).   Specialty: General Practice Contact information: [address] [place] [address] [phone]        Beryle Beams, MD. Schedule an appointment as soon as possible for a visit in 4 week(s).   Specialty: Neurology  Contact information: [address] [place] [address] [phone]               The results of significant diagnostics from this hospitalization (including imaging, microbiology, ancillary and laboratory) are listed below for reference.    Significant Diagnostic Studies: CT HEAD WO CONTRAST  Result Date: 09/13/2020 CLINICAL DATA:  Slurred speech starting at 11 a.m. yesterday EXAM: CT HEAD  WITHOUT CONTRAST TECHNIQUE: Contiguous axial images were obtained from the base of the skull through the vertex without intravenous contrast. COMPARISON:  None. FINDINGS: Brain: Abnormal 2.8 by 1.2 by 1.8 cm focus of hypodensity in the left periventricular white matter extending along the upper portion of the left lentiform nucleus towards the external capsule, concerning for potential subacute infarct. Approximately 0.7 cm hypodensity in the left thalamus, favoring age-indeterminate lacunar infarct. 0.7 by 0.4 cm hypodense lesion in the vicinity of the anterior limb left internal capsule on image 16 of series 2, potentially also a small age indeterminate lacunar infarct. Otherwise, The brainstem, cerebellum, cerebral peduncles, thalami, basal ganglia, basilar cisterns, and ventricular system appear within normal limits. No appreciable mass lesion or intracranial hemorrhage. Vascular: There is atherosclerotic calcification of the cavernous carotid arteries bilaterally. Skull: Unremarkable Sinuses/Orbits: Old right medial orbital wall fracture. Other: No supplemental non-categorized findings. IMPRESSION: 1. Abnormal 2.8 by 1.2 by 1.8 cm focus of hypodensity in the left periventricular white matter extending along the upper portion of the left lentiform nucleus towards the external capsule concerning for potential subacute infarct. 2. Approximately 0.7 cm hypodensity in the left thalamus, favoring age-indeterminate lacunar infarct. 3. 0.7 by 0.4 cm hypodense lesion in the vicinity of the anterior limb left internal capsule, potentially also a small age indeterminate lacunar infarct. 4. Old right medial orbital wall fracture. 5. Atherosclerosis. Critical Value/emergent results were called by telephone at the time of interpretation on 09/13/2020 at 9:43 pm to provider Dr. Vanetta Mulders, who verbally acknowledged these results. Electronically Signed   By: Gaylyn Rong M.D.   On: 09/13/2020 21:44   MR ANGIO HEAD  WO CONTRAST  Result Date: 09/14/2020 CLINICAL DATA:  Neuro deficit, acute stroke suspected. Slurred speech in issues with balance. EXAM: MRI HEAD WITHOUT CONTRAST MRA HEAD WITHOUT CONTRAST TECHNIQUE: Multiplanar, multiecho pulse sequences of the brain and surrounding structures were obtained without intravenous contrast. Angiographic images of the head were obtained using MRA technique without contrast. COMPARISON:  CT head September 13, 2020 FINDINGS: MRI HEAD FINDINGS Brain: Acute infarct involving the left basal ganglia (including posterior left putamen, and right caudate body) and the overlying corona radiata. There is associated edema without substantial mass effect. Additional mild for age scattered T2/FLAIR hyperintensities within the white matter, most likely related to chronic microvascular ischemic disease. No acute hemorrhage. No extra-axial fluid collection. No mass lesion. No midline shift. Basal cisterns are patent. No hydrocephalus. Numerous small dilated perivascular spaces in bilateral basal ganglia. 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 Vascular: Major arterial flow voids are maintained at the skull base. Skull and upper cervical spine: Normal marrow signal. Sinuses/Orbits: Visualized sinuses are clear. Other: No mastoid effusions. MRA HEAD FINDINGS Anterior circulation: Bilateral visible ICAs are patent without evidence of greater than 50% stenosis. Mild right paraclinoid narrowing. Mild-to-moderate narrowing of the distal left M1 MCA. Additional multifocal mild narrowing of bilateral MCA and ACA vessels. Severe stenosis of a proximal right M2 MCA branch (see series 100, images 95 through 100). No aneurysm identified. Posterior  circulation: Visualized bilateral intradural vertebral arteries are patent. Mild multifocal narrowing of the distal basilar artery. Severe stenosis of the left P2 PCA. Moderate right P2 PCA stenosis.  Multifocal irregular narrowing of the more distal PCAs bilaterally. Fetal type left PCA. No aneurysm identified. IMPRESSION: 1. Acute infarct involving the left basal ganglia and overlying corona radiata, as described above. 2. 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 3. Severe left and moderate right P2 PCA stenoses. 4. Mild-to-moderate narrowing of the distal left M1 MCA and additional multifocal mild narrowing. These results will be called to the ordering clinician or representative by the Radiologist Assistant, and communication documented in the PACS or [employer]. Electronically Signed   By: Feliberto Harts MD   On: 09/14/2020 09:38   MR BRAIN WO CONTRAST  Result Date: 09/14/2020 CLINICAL DATA:  Neuro deficit, acute stroke suspected. Slurred speech in issues with balance. EXAM: MRI HEAD WITHOUT CONTRAST MRA HEAD WITHOUT CONTRAST TECHNIQUE: Multiplanar, multiecho pulse sequences of the brain and surrounding structures were obtained without intravenous contrast. Angiographic images of the head were obtained using MRA technique without contrast. COMPARISON:  CT head September 13, 2020 FINDINGS: MRI HEAD FINDINGS Brain: Acute infarct involving the left basal ganglia (including posterior left putamen, and right caudate body) and the overlying corona radiata. There is associated edema without substantial mass effect. Additional mild for age scattered T2/FLAIR hyperintensities within the white matter, most likely related to chronic microvascular ischemic disease. No acute hemorrhage. No extra-axial fluid collection. No mass lesion. No midline shift. Basal cisterns are patent. No hydrocephalus. Numerous small dilated perivascular spaces in bilateral basal ganglia. 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 Vascular: Major arterial flow voids are maintained at the skull base. Skull and upper cervical spine: Normal marrow signal.  Sinuses/Orbits: Visualized sinuses are clear. Other: No mastoid effusions. MRA HEAD FINDINGS Anterior circulation: Bilateral visible ICAs are patent without evidence of greater than 50% stenosis. Mild right paraclinoid narrowing. Mild-to-moderate narrowing of the distal left M1 MCA. Additional multifocal mild narrowing of bilateral MCA and ACA vessels. Severe stenosis of a proximal right M2 MCA branch (see series 100, images 95 through 100). No aneurysm identified. Posterior circulation: Visualized bilateral intradural vertebral arteries are patent. Mild multifocal narrowing of the distal basilar artery. Severe stenosis of the left P2 PCA. Moderate right P2 PCA stenosis. Multifocal irregular narrowing of the more distal PCAs bilaterally. Fetal type left PCA. No aneurysm identified. IMPRESSION: 1. Acute infarct involving the left basal ganglia and overlying corona radiata, as described above. 2. 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 3. Severe left and moderate right P2 PCA stenoses. 4. Mild-to-moderate narrowing of the distal left M1 MCA and additional multifocal mild narrowing. These results will be called to the ordering clinician or representative by the Radiologist Assistant, and communication documented in the PACS or [employer]. Electronically Signed   By: Feliberto Harts MD   On: 09/14/2020 09:38   US Carotid Bilateral  Result Date: 09/14/2020 CLINICAL DATA:  Stroke. EXAM: BILATERAL CAROTID DUPLEX ULTRASOUND TECHNIQUE: Wallace Cullens scale imaging, color Doppler and duplex ultrasound were performed of bilateral carotid and vertebral arteries in the neck. COMPARISON:  MRI 09/14/2020 FINDINGS: Criteria: Quantification of carotid stenosis is based on velocity parameters that correlate the residual internal carotid diameter with NASCET-based stenosis levels, using the diameter of the distal internal carotid lumen as the denominator for stenosis measurement. The following velocity measurements were  obtained:  RIGHT ICA: 92/19 cm/sec CCA: 95/12 cm/sec SYSTOLIC ICA/CCA RATIO:  1.0 ECA: 75 cm/sec LEFT ICA: 56/17 cm/sec CCA: 85/11 cm/sec SYSTOLIC ICA/CCA RATIO:  0.7 ECA: 47 cm/sec RIGHT CAROTID ARTERY: Echogenic plaque at the carotid bulb. External carotid artery is patent with normal waveform. Small amount of echogenic plaque in the proximal internal carotid artery. Normal waveforms and velocities in the internal carotid artery. RIGHT VERTEBRAL ARTERY: Antegrade flow and normal waveform in the right vertebral artery. LEFT CAROTID ARTERY: Small amount of echogenic plaque at the left carotid bulb. External carotid artery is patent with normal waveform. Small amount of echogenic plaque in the proximal internal carotid artery. Normal waveforms and velocities in the internal carotid artery. LEFT VERTEBRAL ARTERY: Antegrade flow and normal waveform in the left vertebral artery. IMPRESSION: 1. Atherosclerotic disease involving bilateral carotid arteries. Estimated degree of stenosis in the internal carotid arteries is less than 50% bilaterally. 2. Patent vertebral arteries with antegrade flow. Electronically Signed   By: Richarda Overlie M.D.   On: 09/14/2020 11:10   DG Swallowing Func-Speech Pathology  Result Date: 09/14/2020 Objective Swallowing Evaluation: Type of Study: MBS-Modified Barium Swallow Study  Patient Details Name: DICKIE CLOE MRN: [id number] Date of Birth: Aug 13, 1940 Today's Date: 09/14/2020 Time: SLP Start Time (ACUTE ONLY): 1401 -SLP Stop Time (ACUTE ONLY): 1447 SLP Time Calculation (min) (ACUTE ONLY): 46 min Past Medical History: Past Medical History: Diagnosis Date . Diabetes mellitus without complication (HCC)  . Hypercholesteremia  . Hypertension  . Parkinson disease [facility])  Past Surgical History: No past surgical history on file. HPI: ALESANDRO STUEVE is a 80 y.o. male with medical history significant for hypertension, Parkinson disease, and type 2 diabetes mellitus who presents to the Emergency  Department due to slurred speech and imbalance which started yesterday (2/28) around 11:30 AM. Patient states that he noted sudden onset of speech difficulty with slurring of words and bumping into things when walking. He denies chest pain, shortness of breath, headache, numbness, tingling or weakness. He denies prior history of stroke. 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 Severe left and moderate right P2 PCA stenoses. Mild-to moderate narrowing of the distal left M1 MCA and additional multifocal mild narrowing. BSE ordered as Pt failed the Yale swallow screen. Pt showed s/sx of oropharyngeal dysphagia and an MBS was recommended.  Subjective: "I am so thirsty." Assessment / Plan / Recommendation CHL IP CLINICAL IMPRESSIONS 09/14/2020 Clinical Impression Pt presents with mild pharyngeal dysphagia characterized by occasional trace deep penetration of thin liquids during the swallow that contacts the cords is sensed and expelled by a strong reflexive cough; the two episodes of this were with a tsp and straw. No penetration or aspiration was visualized with cup sips of thin. Also note mild/moderate valleculae and pyriform residue after the swallow of thin liquid that often required a verbal cue to clear by reapeat swallow. With one of the NTL trials note deep flash penetration, all other trials of NTL there was no penetration noted. Regular textures and puree textures were unremarkable. SLP reviewed the above results with Pt and discussed options at length; Recommend continue with D2/soft/finechop diet and upgrade to thin liquids. Recommend medications be taken with applesauce or pudding; no straws, small cup sips, second dry swallow. Recommend Home Health Speech therapy to evaluate and treat as indicated for pharyngeal strengthening and to clinically adjust diet as subjectively appropriate. SLP  reviewed with Pt that in the home environment diet adjustments may be recommended if "choking" episodes are happening frequently (MBS is a brief snapshot of time with perfect positioning which is not always an accurate picture of meal experience in the home); note all episodes penetrated/aspirated were sensed suggesting subjective bedside presentation is accurate. ST will continue to follow acutely, thank you. SLP Visit Diagnosis Dysphagia, unspecified (R13.10) Attention and concentration deficit following -- Frontal lobe and executive function deficit following -- Impact on safety and function Mild aspiration risk;Moderate aspiration risk   CHL IP TREATMENT RECOMMENDATION 09/14/2020 Treatment Recommendations Defer treatment plan to f/u with SLP;Therapy as outlined in treatment plan below   Prognosis 09/14/2020 Prognosis for Safe Diet Advancement Fair Barriers to Reach Goals -- Barriers/Prognosis Comment -- CHL IP DIET RECOMMENDATION 09/14/2020 SLP Diet Recommendations Dysphagia 2 (Fine chop) solids;Thin liquid Liquid Administration via No straw;Cup Medication Administration Whole meds with puree Compensations Small sips/bites;Multiple dry swallows after each bite/sip;Slow rate Postural Changes Remain semi-upright after after feeds/meals (Comment);Seated upright at 90 degrees   CHL IP OTHER RECOMMENDATIONS 09/14/2020 Recommended Consults -- Oral Care Recommendations Oral care BID Other Recommendations Clarify dietary restrictions;Order thickener from pharmacy;Prohibited food (jello, ice cream, thin soups)   CHL IP FOLLOW UP RECOMMENDATIONS 09/14/2020 Follow up Recommendations Home health SLP   CHL IP FREQUENCY AND DURATION 09/14/2020 Speech Therapy Frequency (ACUTE ONLY) min 2x/week Treatment Duration 1 week      CHL IP ORAL PHASE 09/14/2020 Oral Phase WFL Oral - Pudding Teaspoon -- Oral - Pudding Cup -- Oral - Honey Teaspoon -- Oral - Honey Cup -- Oral - Nectar Teaspoon -- Oral - Nectar Cup -- Oral - Nectar Straw -- Oral - Thin  Teaspoon -- Oral - Thin Cup -- Oral - Thin Straw -- Oral - Puree -- Oral - Mech Soft -- Oral - Regular -- Oral - Multi-Consistency -- Oral - Pill -- Oral Phase - Comment --  CHL IP PHARYNGEAL PHASE 09/14/2020 Pharyngeal Phase Impaired Pharyngeal- Pudding Teaspoon -- Pharyngeal -- Pharyngeal- Pudding Cup -- Pharyngeal -- Pharyngeal- Honey Teaspoon -- Pharyngeal -- Pharyngeal- Honey Cup -- Pharyngeal -- Pharyngeal- Nectar Teaspoon NT Pharyngeal -- Pharyngeal- Nectar Cup Penetration/Aspiration during swallow Pharyngeal Material enters airway, CONTACTS cords and then ejected out Pharyngeal- Nectar Straw NT Pharyngeal -- Pharyngeal- Thin Teaspoon Delayed swallow initiation-pyriform sinuses;Penetration/Aspiration during swallow;Trace aspiration;Reduced airway/laryngeal closure;Pharyngeal residue - valleculae;Pharyngeal residue - pyriform Pharyngeal -- Pharyngeal- Thin Cup Delayed swallow initiation-pyriform sinuses;Pharyngeal residue - valleculae;Pharyngeal residue - pyriform Pharyngeal Material does not enter airway Pharyngeal- Thin Straw Penetration/Aspiration during swallow;Pharyngeal residue - valleculae;Pharyngeal residue - pyriform;Delayed swallow initiation-vallecula;Delayed swallow initiation-pyriform sinuses Pharyngeal Material enters airway, remains ABOVE vocal cords then ejected out Pharyngeal- Puree WFL Pharyngeal -- Pharyngeal- Mechanical Soft NT Pharyngeal -- Pharyngeal- Regular WFL Pharyngeal -- Pharyngeal- Multi-consistency WFL Pharyngeal -- Pharyngeal- Pill Pharyngeal residue - pyriform Pharyngeal -- Pharyngeal Comment --  No flowsheet data found. Amelia H. Romie Levee, CCC-SLP Speech Language Pathologist Georgetta Haber 09/14/2020, 3:51 PM              ECHOCARDIOGRAM COMPLETE  Result Date: 09/14/2020    ECHOCARDIOGRAM REPORT   Patient Name:   JAHMAL DUNAVANT Date of Exam: 09/14/2020 Medical Rec #:  [id number]      Height:       65.0 in Accession #:    [id number]     Weight:       190.9 lb Date of Birth:   03/02/1941      BSA:  1.939 m Patient Age:    79 years       BP:           141/72 mmHg Patient Gender: M              HR:           61 bpm. [facility]am [facility] Procedure: 2D Echo and Intracardiac Opacification Agent Indications:    Stroke I63.9  History:        Patient has no prior history of Echocardiogram examinations.                 Stroke; Risk Factors:Hypertension, Dyslipidemia, Non-Smoker and                 Diabetes. Parkinson's Disease.  Sonographer:    Jeryl Columbia RDCS (AE) Referring Phys: [id number] OLADAPO ADEFESO IMPRESSIONS  1. Left ventricular ejection fraction, by estimation, is 60 to 65%. The left ventricle has normal function. The left ventricle has no regional wall motion abnormalities. There is moderate left ventricular hypertrophy. Left ventricular diastolic parameters are consistent with Grade I diastolic dysfunction (impaired relaxation). Elevated left atrial pressure.  2. Right ventricular systolic function is normal. The right ventricular size is normal.  3. Left atrial size was severely dilated.  4. The mitral valve is normal in structure. No evidence of mitral valve regurgitation. No evidence of mitral stenosis. Moderate mitral annular calcification.  5. The aortic valve has an indeterminant number of cusps. There is moderate calcification of the aortic valve. There is moderate thickening of the aortic valve. Aortic valve regurgitation is not visualized. Moderate aortic valve stenosis. FINDINGS  Left Ventricle: Left ventricular ejection fraction, by estimation, is 60 to 65%. The left ventricle has normal function. The left ventricle has no regional wall motion abnormalities. Definity contrast agent was given IV to delineate the left ventricular  endocardial borders. The left ventricular internal cavity size was normal in size. There is moderate left ventricular hypertrophy. Left ventricular diastolic parameters are consistent with Grade I diastolic dysfunction (impaired  relaxation). Elevated left atrial pressure. Right Ventricle: The right ventricular size is normal. No increase in right ventricular wall thickness. Right ventricular systolic function is normal. Left Atrium: Left atrial size was severely dilated. Right Atrium: Right atrial size was normal in size. Pericardium: There is no evidence of pericardial effusion. Mitral Valve: The mitral valve is normal in structure. There is mild thickening of the mitral valve leaflet(s). There is mild calcification of the mitral valve leaflet(s). Moderate mitral annular calcification. No evidence of mitral valve regurgitation. No evidence of mitral valve stenosis. Tricuspid Valve: The tricuspid valve is normal in structure. Tricuspid valve regurgitation is not demonstrated. No evidence of tricuspid stenosis. Aortic Valve: The aortic valve has an indeterminant number of cusps. There is moderate calcification of the aortic valve. There is moderate thickening of the aortic valve. There is moderate aortic valve annular calcification. Aortic valve regurgitation is not visualized. Moderate aortic stenosis is present. Aortic valve mean gradient measures 22.3 mmHg. Aortic valve peak gradient measures 39.6 mmHg. Aortic valve area, by VTI measures 1.13 cm. Pulmonic Valve: The pulmonic valve was not well visualized. Pulmonic valve regurgitation is not visualized. No evidence of pulmonic stenosis. Aorta: The aortic root is normal in size and structure. Pulmonary Artery: Indeterminant PASP, inadequate TR jet. IAS/Shunts: The interatrial septum was not well visualized.  LEFT VENTRICLE PLAX 2D LVIDd:         4.25 cm  Diastology LVIDs:  2.88 cm  LV e' medial:    5.55 cm/s LV PW:         1.35 cm  LV E/e' medial:  18.2 LV IVS:        1.35 cm  LV e' lateral:   6.85 cm/s LVOT diam:     1.90 cm  LV E/e' lateral: 14.7 LV SV:         86 LV SV Index:   44 LVOT Area:     2.84 cm  RIGHT VENTRICLE RV S prime:     16.20 cm/s TAPSE (M-mode): 3.4 cm LEFT  ATRIUM             Index       RIGHT ATRIUM           Index LA diam:        4.20 cm 2.17 cm/m  RA Area:     14.30 cm LA Vol (A2C):   65.0 ml 33.51 ml/m RA Volume:   35.50 ml  18.30 ml/m LA Vol (A4C):   80.3 ml 41.40 ml/m LA Biplane Vol: 79.1 ml 40.78 ml/m  AORTIC VALVE AV Area (Vmax):    1.00 cm AV Area (Vmean):   1.05 cm AV Area (VTI):     1.13 cm AV Vmax:           314.82 cm/s AV Vmean:          220.828 cm/s AV VTI:            0.755 m AV Peak Grad:      39.6 mmHg AV Mean Grad:      22.3 mmHg LVOT Vmax:         111.24 cm/s LVOT Vmean:        81.962 cm/s LVOT VTI:          0.302 m LVOT/AV VTI ratio: 0.40  AORTA Ao Root diam: 2.80 cm MITRAL VALVE MV Area (PHT): 1.85 cm     SHUNTS MV Decel Time: 411 msec     Systemic VTI:  0.30 m MV E velocity: 101.00 cm/s  Systemic Diam: 1.90 cm MV A velocity: 138.00 cm/s MV E/A ratio:  0.73 Dina RichJonathan Branch MD Electronically signed by Dina RichJonathan Branch MD Signature Date/Time: 09/14/2020/12:01:08 PM    Final     Microbiology: Recent Results (from the past 240 hour(s))  SARS CORONAVIRUS 2 (TAT 6-24 HRS) Nasopharyngeal Nasopharyngeal Swab     Status: None   Collection Time: 09/13/20 10:48 PM   Specimen: Nasopharyngeal Swab  Result Value Ref Range Status   SARS Coronavirus 2 NEGATIVE NEGATIVE Final    Comment: (NOTE) SARS-CoV-2 target nucleic acids are NOT DETECTED.  The SARS-CoV-2 RNA is generally detectable in upper and lower respiratory specimens during the acute phase of infection. Negative results do not preclude SARS-CoV-2 infection, do not rule out co-infections with other pathogens, and should not be used as the sole basis for treatment or other patient management decisions. Negative results must be combined with clinical observations, patient history, and epidemiological information. The expected result is Negative.  Fact Sheet for Patients: HairSlick.nohttps://www.fda.gov/media/138098/download  Fact Sheet for Healthcare  Providers: quierodirigir.comhttps://www.fda.gov/media/138095/download  This test is not yet approved or cleared by the [place] FDA and  has been authorized for detection and/or diagnosis of SARS-CoV-2 by FDA under an Emergency Use Authorization (EUA). This EUA will remain  in effect (meaning this test can be used) for the duration of the COVID-19 declaration under Se ction 564(b)(1) of the Act,  21 U.S.C. section 360bbb-3(b)(1), unless the authorization is terminated or revoked sooner.  Performed at [facility], [address], [place], [place] [address]      Labs: Basic Metabolic Panel: Recent Labs  Lab 09/13/20 2025 09/14/20 0526  NA 138 136  K 3.8 3.6  CL 103 102  CO2 24 25  GLUCOSE 162* 157*  BUN 21 18  CREATININE 0.99 0.80  CALCIUM 8.9 8.3*  MG  --  2.0  PHOS  --  3.3   Liver Function Tests: Recent Labs  Lab 09/13/20 2025 09/14/20 0526  AST 24 22  ALT 37 35  ALKPHOS 65 58  BILITOT 0.5 0.7  PROT 7.7 6.8  ALBUMIN 4.0 3.6   CBC: Recent Labs  Lab 09/13/20 2025 09/14/20 0526  WBC 8.5 7.5  NEUTROABS 5.5  --   HGB 12.9* 11.5*  HCT 37.1* 34.3*  MCV 91.8 92.7  PLT 239 217   CBG: Recent Labs  Lab 09/14/20 0042 09/14/20 0338 09/14/20 0746 09/14/20 1110 09/14/20 1610  GLUCAP 176* 146* 174* 176* 180*    Signed:  Vassie Loll MD.  [facility] 09/14/2020, 5:38 PM

## 2020-09-14 NOTE — Evaluation (Signed)
Occupational Therapy Evaluation Patient Details Name: John Thornton MRN: [id number] DOB: 10-09-1940 Today's Date: 09/14/2020    History of Present Illness John Thornton is a 80 y.o. male with medical history significant for hypertension, Parkinson disease, and type 2 diabetes mellitus who presents to the Emergency Department due to slurred speech and imbalance which started yesterday (2/28) around 11:30 AM. Patient states that he noted sudden onset of speech difficulty with slurring of words and bumping into things when walking. He denies chest pain, shortness of breath, headache, numbness, tingling or weakness. He denies prior history of stroke.   Clinical Impression   Pt tolerated evaluation well. Able to complete functional ambulation in hall, down ramps, and up steps with Mod I. Pt demonstrated UE ROM and MMT within functional limits. Pt did demonstrate R side lean but did not show other signs of neglect or hemianopia. Pt not recommended for further OT services due to UE functional use WFL.     Follow Up Recommendations  No OT follow up    Equipment Recommendations  None recommended by OT           Precautions / Restrictions Precautions Precautions: Fall Restrictions Weight Bearing Restrictions: No      Mobility Bed Mobility Overal bed mobility: Modified Independent             General bed mobility comments: Use of railing to pull from supine to sit.    Transfers Overall transfer level: Modified independent               General transfer comment: R side lean. [place] very closely to objects on R side.    Balance Overall balance assessment: Needs assistance Sitting-balance support: Feet unsupported Sitting balance-Leahy Scale: Good     Standing balance support: No upper extremity supported Standing balance-Leahy Scale: Fair (fair to good R side lean) Standing balance comment: R side lean.                           ADL either performed or  assessed with clinical judgement   ADL Overall ADL's : Modified independent                                       General ADL Comments: Noted to have R side lean during functional ambulation without RW. Able to open tooth brush container independently.     Vision Baseline Vision/History: Wears glasses Vision Assessment?: No apparent visual deficits Additional Comments: Noted to have peripheral vision WFL.                Pertinent Vitals/Pain Pain Assessment: No/denies pain     Hand Dominance Right   Extremity/Trunk Assessment Upper Extremity Assessment Upper Extremity Assessment: Overall WFL for tasks assessed   Lower Extremity Assessment Lower Extremity Assessment: Defer to PT evaluation   Cervical / Trunk Assessment Cervical / Trunk Assessment: Normal   Communication Communication Communication: Expressive difficulties (Mild surred speech.)   Cognition Arousal/Alertness: Awake/alert Behavior During Therapy: WFL for tasks assessed/performed Overall Cognitive Status: Within Functional Limits for tasks assessed  Home Living Family/patient expects to be discharged to:: Private residence Living Arrangements: Spouse/significant other Available Help at Discharge: Family;Available PRN/intermittently (Takes care of wife who has dementia.) Type of Home: House Home Access: Level entry     Home Layout: One level     Bathroom Shower/Tub: Chief Strategy Officer: Standard     Home Equipment: Environmental consultant - 2 wheels;Wheelchair - manual;Cane - single point;Other (comment) (Rollator)          Prior Functioning/Environment Level of Independence: Independent        Comments: Also takes care of his wife.                      OT Goals(Current goals can be found in the care plan section) Acute Rehab OT Goals Patient Stated Goal: Return home.  OT Frequency:                  Co-evaluation PT/OT/SLP Co-Evaluation/Treatment: Yes Reason for Co-Treatment: To address functional/ADL transfers   OT goals addressed during session: ADL's and self-care;Strengthening/ROM      AM-PAC OT "6 Clicks" Daily Activity     Outcome Measure Help from another person eating meals?: None Help from another person taking care of personal grooming?: None Help from another person toileting, which includes using toliet, bedpan, or urinal?: None Help from another person bathing (including washing, rinsing, drying)?: None Help from another person to put on and taking off regular upper body clothing?: None Help from another person to put on and taking off regular lower body clothing?: None 6 Click Score: 24   End of Session    Activity Tolerance: Patient tolerated treatment well Patient left: in chair;with call bell/phone within reach  OT Visit Diagnosis: Unsteadiness on feet (R26.81)                Time: 4481-8563 OT Time Calculation (min): 15 min Charges:  OT General Charges $OT Visit: 1 Visit OT Evaluation $OT Eval Low Complexity: 1 Low  SAMUEL CRICKENBERGER OT, MOT   Danie Chandler 09/14/2020, 12:29 PM

## 2020-09-14 NOTE — Evaluation (Signed)
  Modified Barium Swallow Progress Note  Patient Details  Name: John Thornton MRN: [id number] Date of Birth: 1941/02/24  Today's Date: 09/14/2020  Modified Barium Swallow completed.  Full report located under Chart Review in the Imaging Section.  Brief recommendations include the following:  Clinical Impression  Pt presents with mild pharyngeal dysphagia characterized by occasional trace deep penetration of thin liquids during the swallow that contacts the cords is sensed and expelled by a strong reflexive cough; the two episodes of this were with a tsp and straw. No penetration or aspiration was visualized with cup sips of thin. Also note mild/moderate valleculae and pyriform residue after the swallow of thin liquid that often required a verbal cue to clear by reapeat swallow. With one of the NTL trials note deep flash penetration, all other trials of NTL there was no penetration noted. Regular textures and puree textures were unremarkable. SLP reviewed the above results with Pt and discussed options at length; Recommend continue with D2/soft/finechop diet and upgrade to thin liquids. Recommend medications be taken with applesauce or pudding; no straws, small cup sips, second dry swallow. Recommend Home Health Speech therapy to evaluate and treat as indicated for pharyngeal strengthening and to clinically adjust diet as subjectively appropriate. SLP reviewed with Pt that in the home environment diet adjustments may be recommended if "choking" episodes are happening frequently (MBS is a brief snapshot of time with perfect positioning which is not always an accurate picture of meal experience in the home); note all episodes penetrated/aspirated were sensed suggesting subjective bedside presentation is accurate. ST will continue to follow acutely, thank you.   Swallow Evaluation Recommendations       SLP Diet Recommendations: Dysphagia 2 (Fine chop) solids;Thin liquid   Liquid Administration via: No  straw;Cup   Medication Administration: Whole meds with puree   Supervision: Patient able to self feed;Intermittent supervision to cue for compensatory strategies   Compensations: Small sips/bites;Multiple dry swallows after each bite/sip;Slow rate   Postural Changes: Remain semi-upright after after feeds/meals (Comment);Seated upright at 90 degrees   Oral Care Recommendations: Oral care BID      John Thornton, CCC-SLP Speech Language Pathologist   John Thornton 09/14/2020,3:48 PM

## 2020-09-21 ENCOUNTER — Telehealth (HOSPITAL_COMMUNITY): Payer: Self-pay | Admitting: Physical Therapy

## 2020-09-21 ENCOUNTER — Ambulatory Visit (HOSPITAL_COMMUNITY): Payer: No Typology Code available for payment source | Admitting: Physical Therapy

## 2020-09-21 NOTE — Telephone Encounter (Signed)
pt's dtr called to cx this appt due to the va did not approve this.

## 2022-07-12 IMAGING — US US CAROTID DUPLEX BILAT
1 series · 13 of 24 positions shown · non-contrast
Comparison: MRI 09/14/2020

CLINICAL DATA: Stroke.

EXAM:
BILATERAL CAROTID DUPLEX ULTRASOUND
TECHNIQUE: Gray scale imaging, color Doppler and duplex ultrasound were
performed of bilateral carotid and vertebral arteries in the neck.

[Series 1: us carotid bilateral · 13 of 68 slices shown]
[im 1/68]
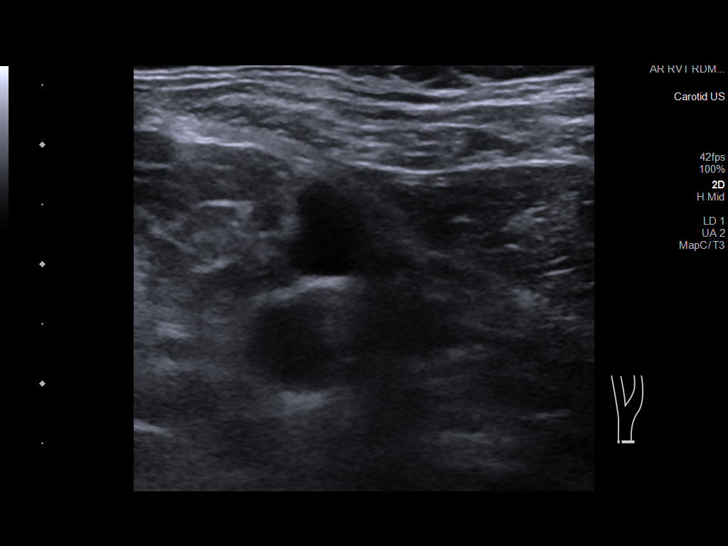
[im 6/68]
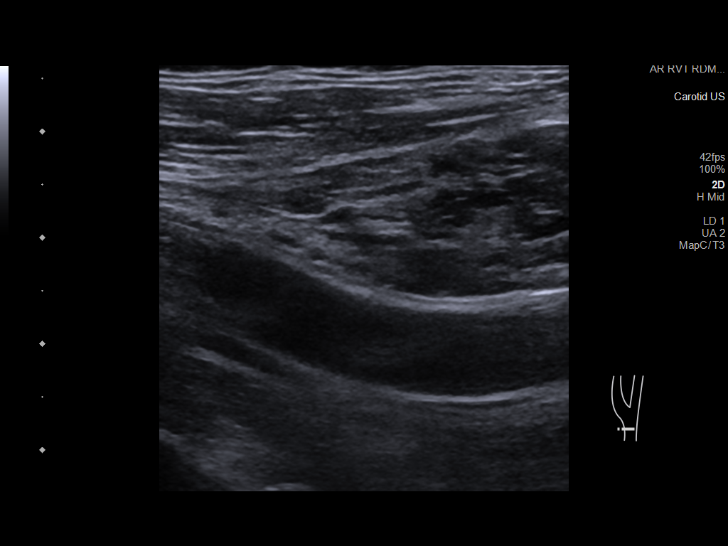
[im 12/68]
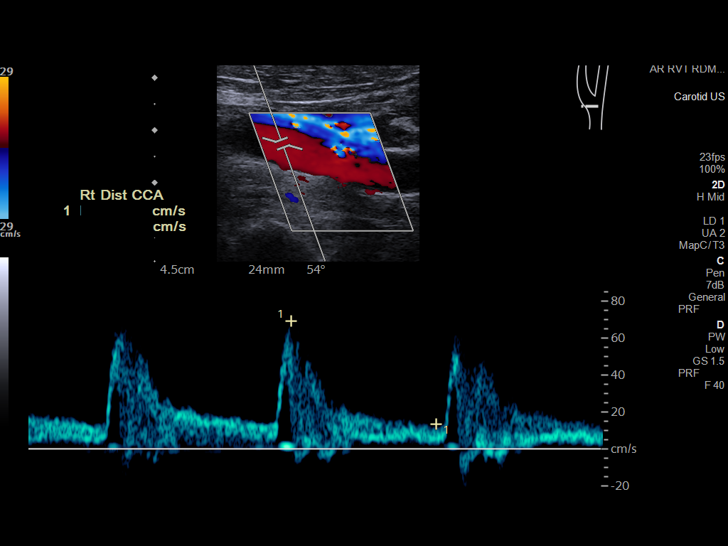
[im 18/68]
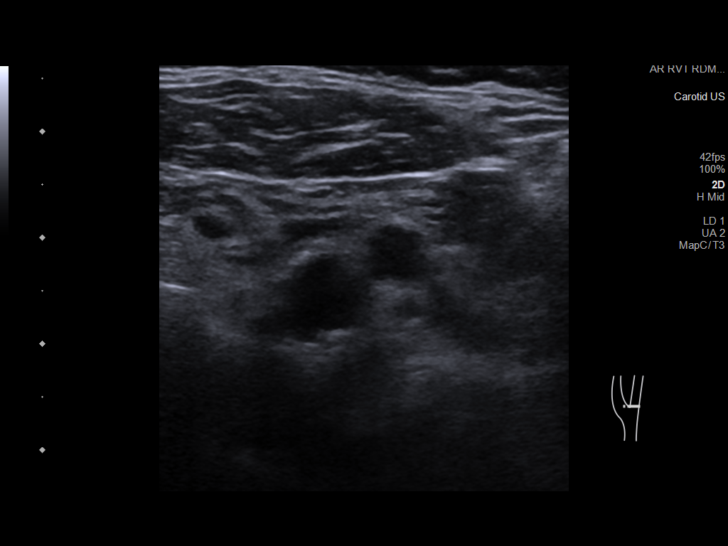
[im 24/68]
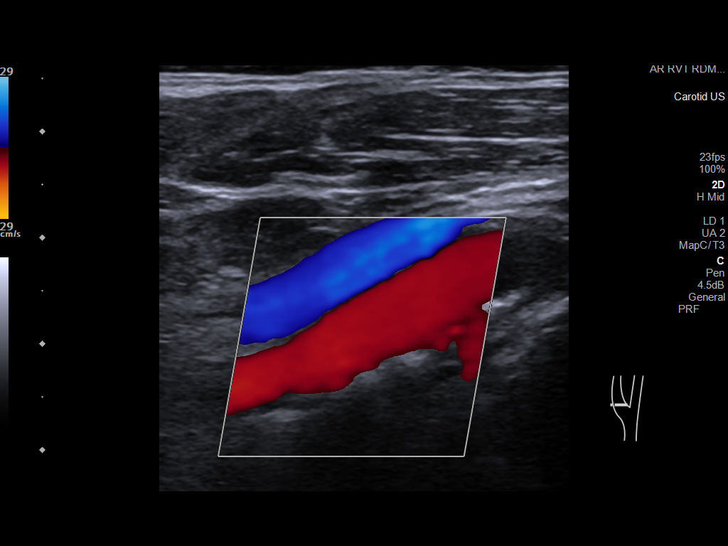
[im 30/68]
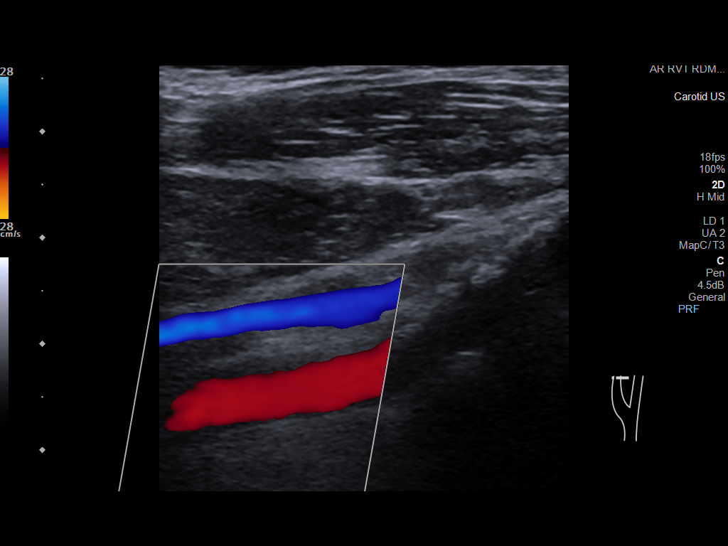
[im 35/68]
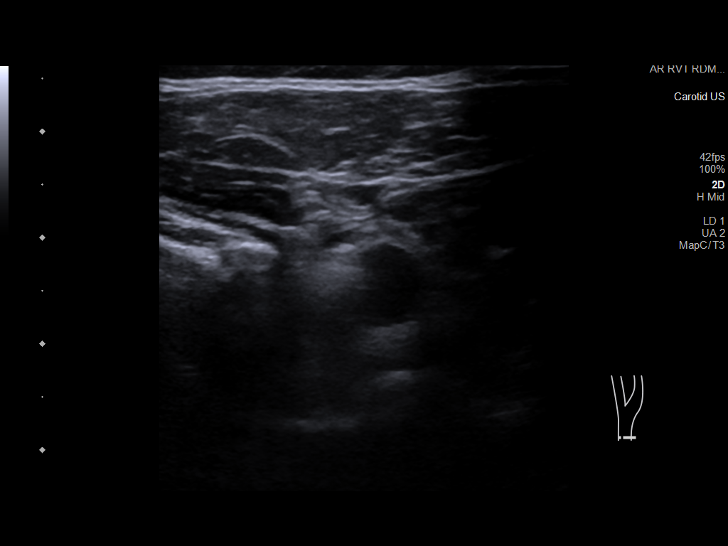
[im 38/68]
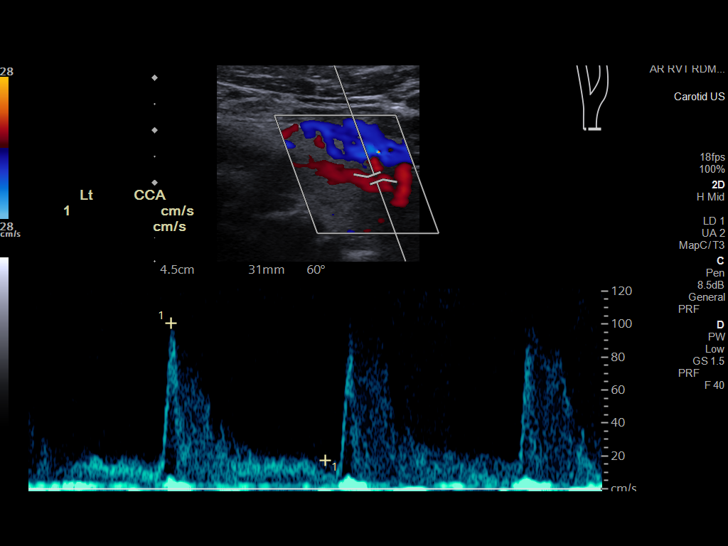
[im 44/68]
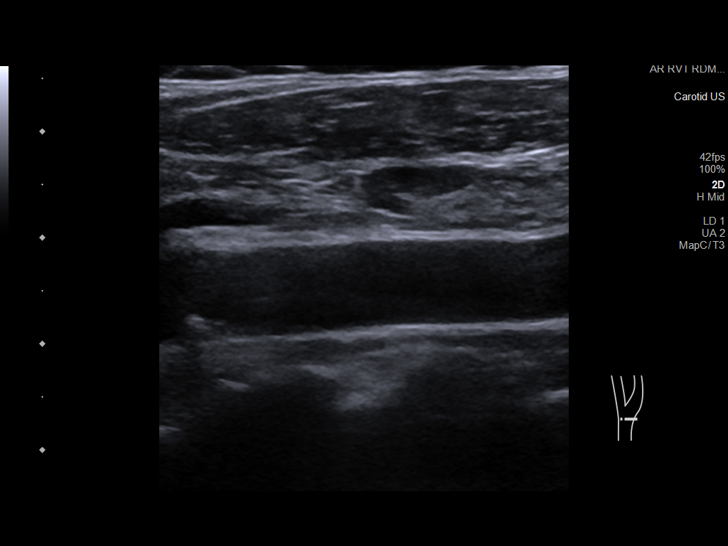
[im 50/68]
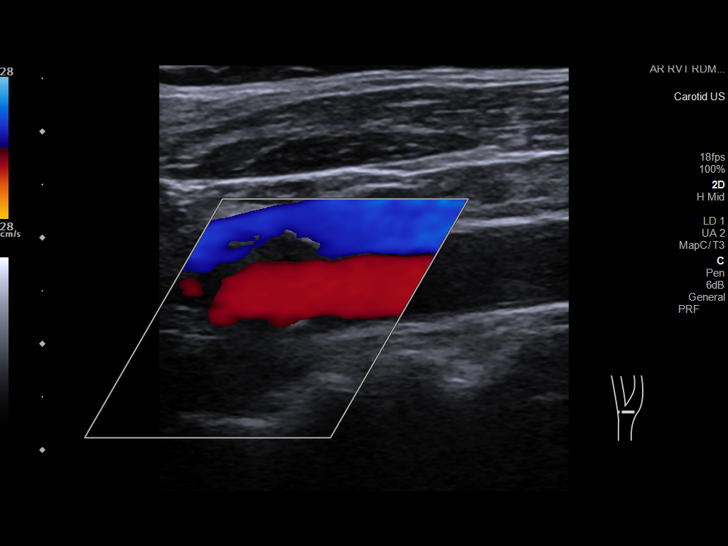
[im 56/68]
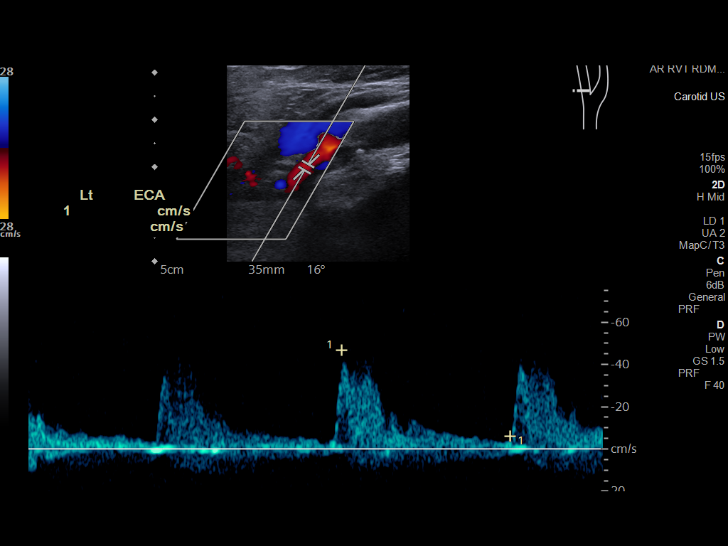
[im 62/68]
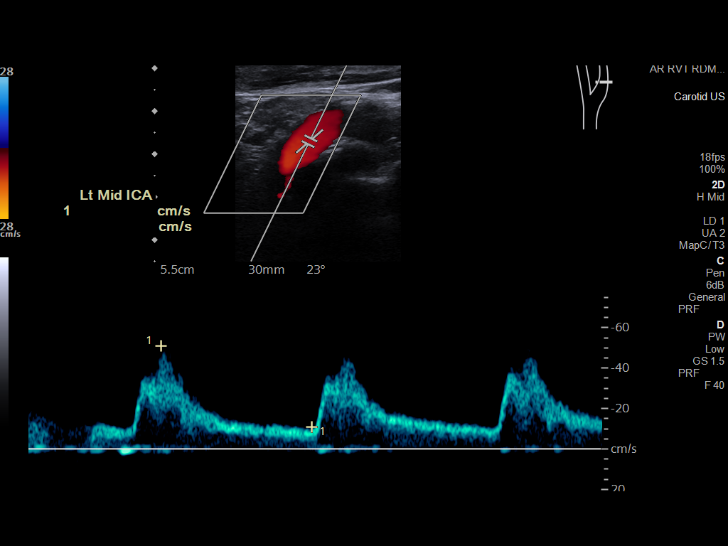
[im 68/68]
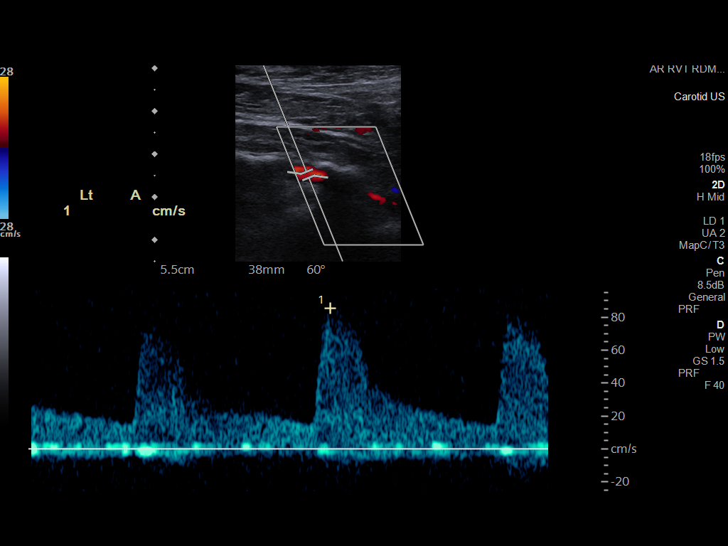

[13 of 24 positions shown; findings below may reference images not displayed]

FINDINGS: Criteria: Quantification of carotid stenosis is based on velocity
parameters that correlate the residual internal carotid diameter
with NASCET-based stenosis levels, using the diameter of the distal
internal carotid lumen as the denominator for stenosis measurement.

The following velocity measurements were obtained:

RIGHT

ICA: 92/19 cm/sec

CCA: 95/12 cm/sec

SYSTOLIC ICA/CCA RATIO:

ECA: 75 cm/sec

LEFT

ICA: 56/17 cm/sec

CCA: 85/11 cm/sec

SYSTOLIC ICA/CCA RATIO:

ECA: 47 cm/sec

RIGHT CAROTID ARTERY: Echogenic plaque at the carotid bulb. External
carotid artery is patent with normal waveform. Small amount of
echogenic plaque in the proximal internal carotid artery. Normal
waveforms and velocities in the internal carotid artery.

RIGHT VERTEBRAL ARTERY: Antegrade flow and normal waveform in the
right vertebral artery.

LEFT CAROTID ARTERY: Small amount of echogenic plaque at the left
carotid bulb. External carotid artery is patent with normal
waveform. Small amount of echogenic plaque in the proximal internal
carotid artery. Normal waveforms and velocities in the internal
carotid artery.

LEFT VERTEBRAL ARTERY: Antegrade flow and normal waveform in the
left vertebral artery.
IMPRESSION: 1. Atherosclerotic disease involving bilateral carotid arteries.
Estimated degree of stenosis in the internal carotid arteries is
less than 50% bilaterally.
2. Patent vertebral arteries with antegrade flow.

## 2022-07-12 IMAGING — MR MR MRA HEAD W/O CM
1 series · 48 of 48 positions shown · non-contrast
Comparison: CT head September 13, 2020

CLINICAL DATA: Neuro deficit, acute stroke suspected. Slurred
speech in issues with balance.

EXAM:
MRI HEAD WITHOUT CONTRAST
MRA HEAD WITHOUT CONTRAST
TECHNIQUE: Multiplanar, multiecho pulse sequences of the brain and surrounding
structures were obtained without intravenous contrast. Angiographic
images of the head were obtained using MRA technique without
contrast.

[Series 100: TOF · axial · 0.5mm · 0.76mm/px · z∈[-15,+81]mm · 48 of 216 slices shown]
[im 1/216]
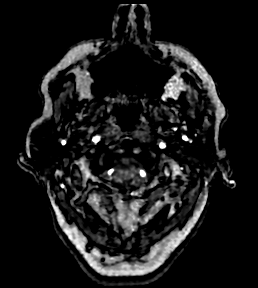
[im 5/216]
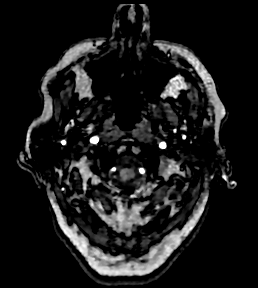
[im 10/216]
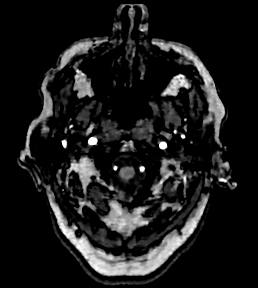
[im 14/216]
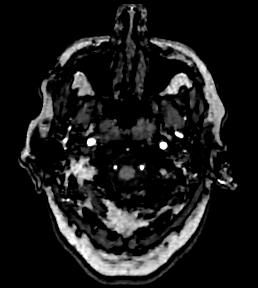
[im 19/216]
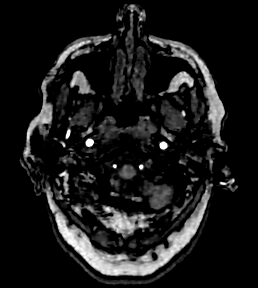
[im 23/216]
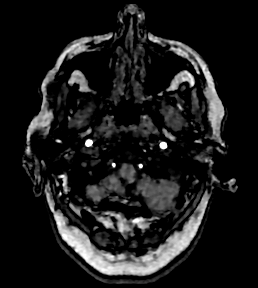
[im 28/216]
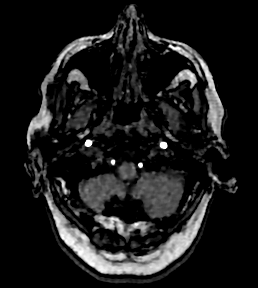
[im 33/216]
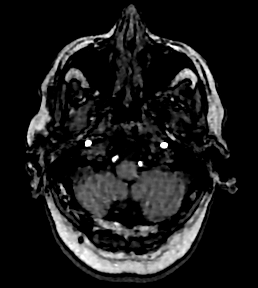
[im 37/216]
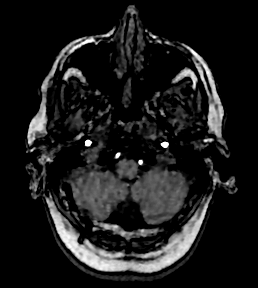
[im 42/216]
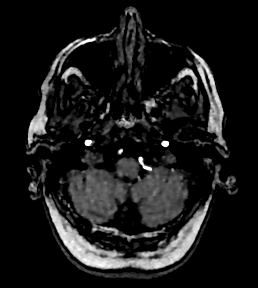
[im 46/216]
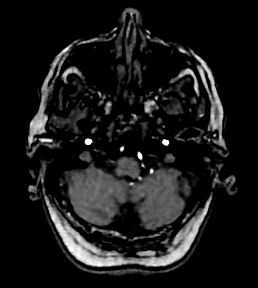
[im 51/216]
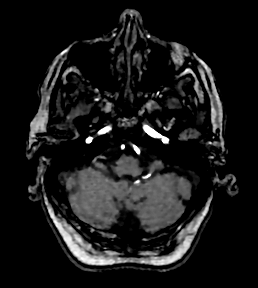
[im 55/216]
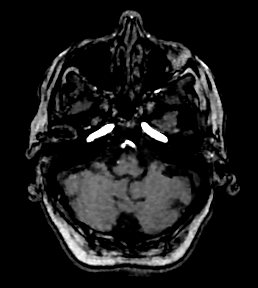
[im 60/216]
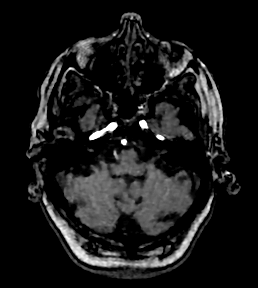
[im 65/216]
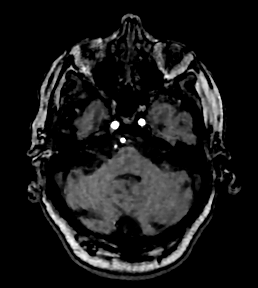
[im 69/216]
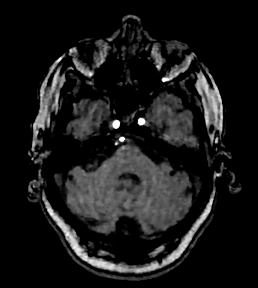
[im 74/216]
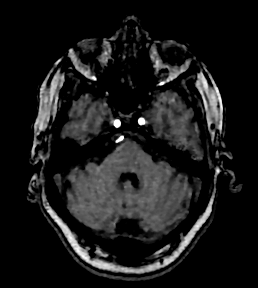
[im 78/216]
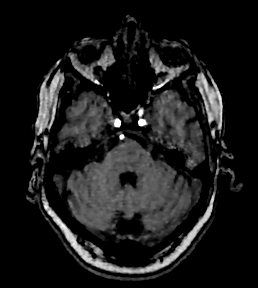
[im 83/216]
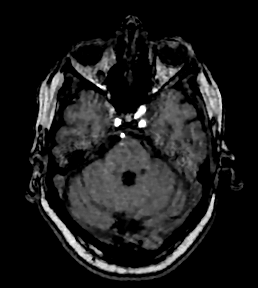
[im 87/216]
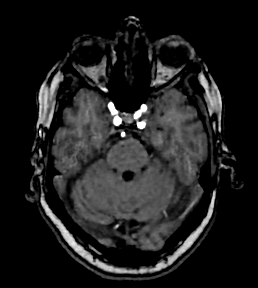
[im 92/216]
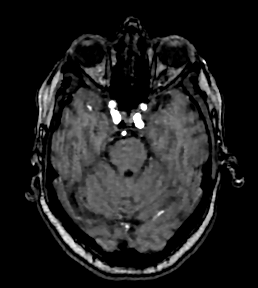
[im 97/216]
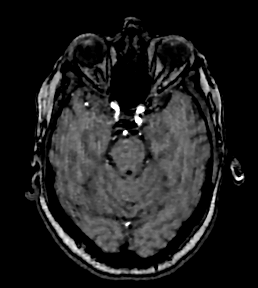
[im 101/216]
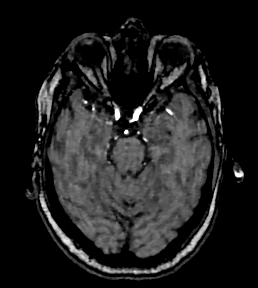
[im 106/216]
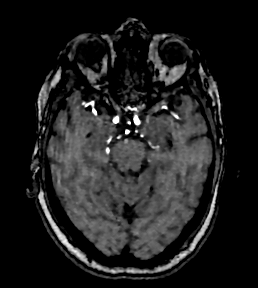
[im 110/216]
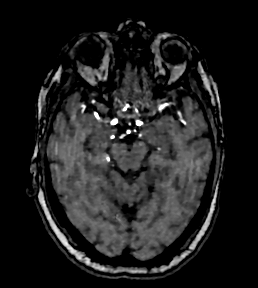
[im 115/216]
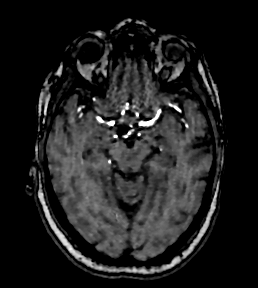
[im 119/216]
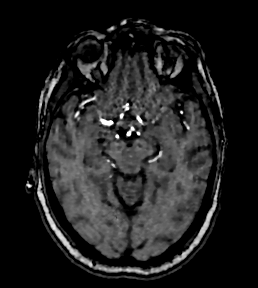
[im 124/216]
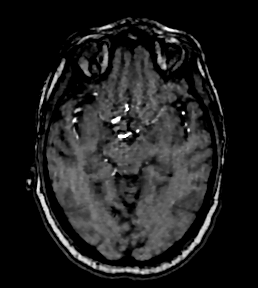
[im 129/216]
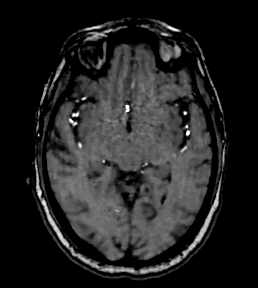
[im 133/216]
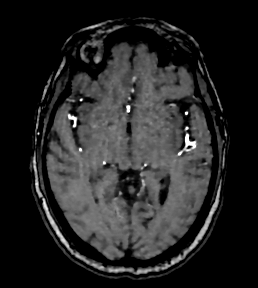
[im 138/216]
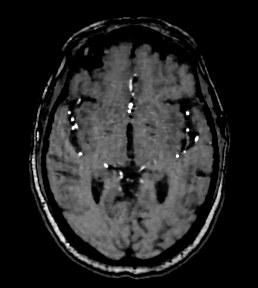
[im 142/216]
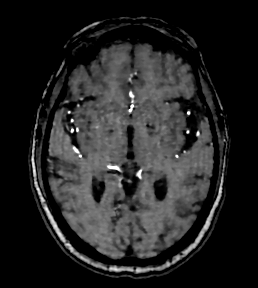
[im 147/216]
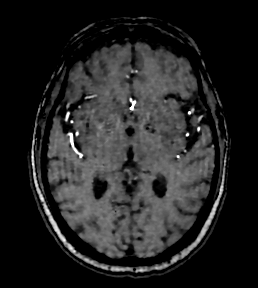
[im 151/216]
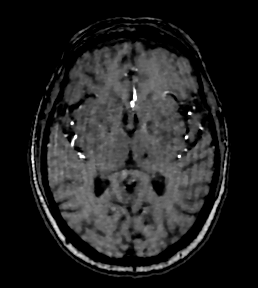
[im 156/216]
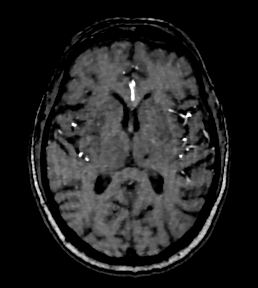
[im 161/216]
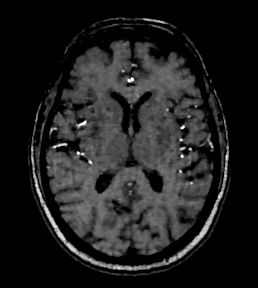
[im 165/216]
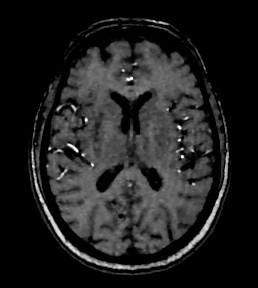
[im 170/216]
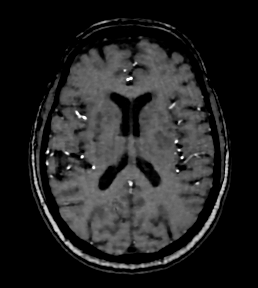
[im 174/216]
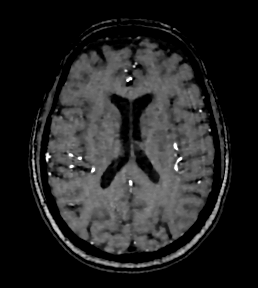
[im 179/216]
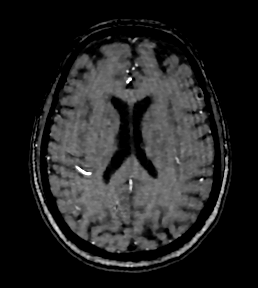
[im 183/216]
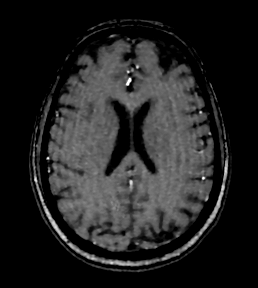
[im 188/216]
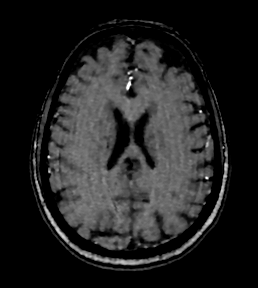
[im 193/216]
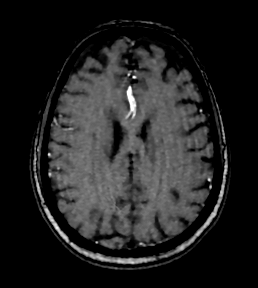
[im 197/216]
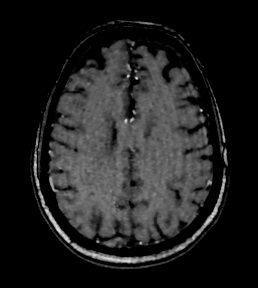
[im 202/216]
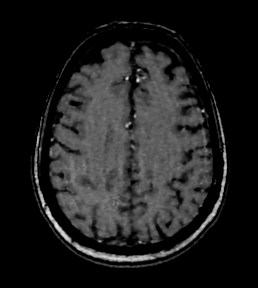
[im 206/216]
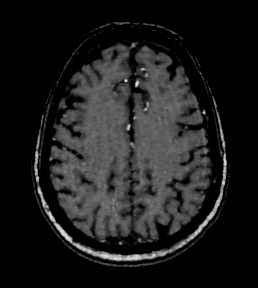
[im 211/216]
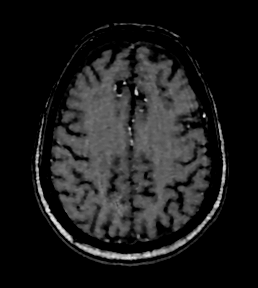
[im 216/216]
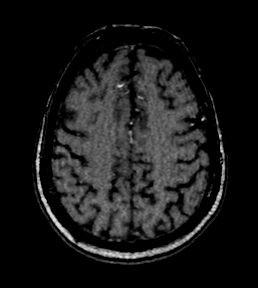

[48 of 48 positions shown; findings below may reference images not displayed]

FINDINGS: MRI HEAD FINDINGS

Brain: Acute infarct involving the left basal ganglia (including
posterior left putamen, and right caudate body) and the overlying
corona radiata. There is associated edema without substantial mass
effect. Additional mild for age scattered T2/FLAIR hyperintensities
within the white matter, most likely related to chronic
microvascular ischemic disease. No acute hemorrhage. No extra-axial
fluid collection. No mass lesion. No midline shift. Basal cisterns
are patent. No hydrocephalus. Numerous small dilated perivascular
spaces in bilateral basal ganglia. Small prominent retro cerebellar
CSF, mega cisterna magna versus arachnoid cyst without significant
mass effect.

Vascular: Major arterial flow voids are maintained at the skull
base.

Skull and upper cervical spine: Normal marrow signal.

Sinuses/Orbits: Visualized sinuses are clear.

Other: No mastoid effusions.

MRA HEAD FINDINGS

Anterior circulation: Bilateral visible ICAs are patent without
evidence of greater than 50% stenosis. Mild right paraclinoid
narrowing. Mild-to-moderate narrowing of the distal left M1 MCA.
Additional multifocal mild narrowing of bilateral MCA and ACA
vessels. Severe stenosis of a proximal right M2 MCA branch (see
series 100, images 95 through 100). No aneurysm identified.

Posterior circulation: Visualized bilateral intradural vertebral
arteries are patent. Mild multifocal narrowing of the distal basilar
artery. Severe stenosis of the left P2 PCA. Moderate right P2 PCA
stenosis. Multifocal irregular narrowing of the more distal PCAs
bilaterally. Fetal type left PCA. No aneurysm identified.
IMPRESSION: 1. Acute infarct involving the left basal ganglia and overlying
corona radiata, as described above.
2. Severe stenosis of a proximal right M2 MCA branch.
3. Severe left and moderate right P2 PCA stenoses.
4. Mild-to-moderate narrowing of the distal left M1 MCA and
additional multifocal mild narrowing.

These results will be called to the ordering clinician or
representative by the Radiologist Assistant, and communication
documented in the PACS or [REDACTED].
